# Patient Record
Sex: Male | Born: 1990 | Race: Black or African American | Hispanic: No | Marital: Single | Smoking: Current every day smoker
Health system: Southern US, Community
[De-identification: ages and names within clinical notes are randomized; demographics above are authoritative.]

## PROBLEM LIST (undated history)

## (undated) ENCOUNTER — Emergency Department (HOSPITAL_COMMUNITY): Admission: EM | Payer: Managed Care, Other (non HMO) | Source: Home / Self Care

## (undated) DIAGNOSIS — K219 Gastro-esophageal reflux disease without esophagitis: Secondary | ICD-10-CM

---

## 2009-05-16 ENCOUNTER — Emergency Department (HOSPITAL_COMMUNITY): Admission: EM | Admit: 2009-05-16 | Discharge: 2009-05-16 | Payer: Self-pay | Admitting: Emergency Medicine

## 2010-03-10 ENCOUNTER — Emergency Department (HOSPITAL_COMMUNITY): Admission: EM | Admit: 2010-03-10 | Discharge: 2010-03-10 | Payer: Self-pay | Admitting: Emergency Medicine

## 2010-06-02 ENCOUNTER — Emergency Department (HOSPITAL_COMMUNITY): Admission: EM | Admit: 2010-06-02 | Discharge: 2010-06-02 | Payer: Self-pay | Admitting: Family Medicine

## 2010-09-05 ENCOUNTER — Emergency Department (HOSPITAL_COMMUNITY): Admission: EM | Admit: 2010-09-05 | Discharge: 2010-09-05 | Payer: Self-pay | Admitting: Emergency Medicine

## 2010-09-07 ENCOUNTER — Emergency Department (HOSPITAL_COMMUNITY)
Admission: EM | Admit: 2010-09-07 | Discharge: 2010-09-07 | Payer: Self-pay | Source: Home / Self Care | Admitting: Family Medicine

## 2010-09-20 DEATH — deceased

## 2011-01-07 ENCOUNTER — Emergency Department (HOSPITAL_COMMUNITY): Payer: No Typology Code available for payment source

## 2011-01-07 ENCOUNTER — Emergency Department (HOSPITAL_COMMUNITY)
Admission: EM | Admit: 2011-01-07 | Discharge: 2011-01-07 | Disposition: A | Discharge status: Home / Self Care | Payer: No Typology Code available for payment source | Attending: Emergency Medicine | Admitting: Emergency Medicine

## 2011-01-07 DIAGNOSIS — M549 Dorsalgia, unspecified: Secondary | ICD-10-CM | POA: Insufficient documentation

## 2011-01-07 DIAGNOSIS — R079 Chest pain, unspecified: Secondary | ICD-10-CM | POA: Insufficient documentation

## 2011-12-30 ENCOUNTER — Encounter (HOSPITAL_COMMUNITY): Payer: Self-pay

## 2011-12-30 ENCOUNTER — Emergency Department (INDEPENDENT_AMBULATORY_CARE_PROVIDER_SITE_OTHER)
Admission: EM | Admit: 2011-12-30 | Discharge: 2011-12-30 | Disposition: A | Discharge status: Home / Self Care | Payer: BC Managed Care – PPO | Source: Home / Self Care | Attending: Family Medicine | Admitting: Family Medicine

## 2011-12-30 DIAGNOSIS — N341 Nonspecific urethritis: Secondary | ICD-10-CM

## 2011-12-30 MED ORDER — AZITHROMYCIN 250 MG PO TABS
1000.0000 mg | ORAL_TABLET | Freq: Once | ORAL | Status: AC
  Administered 2011-12-30: 1000 mg via ORAL

## 2011-12-30 MED ORDER — LIDOCAINE HCL (PF) 1 % IJ SOLN
INTRAMUSCULAR | Status: AC
  Filled 2011-12-30: qty 5

## 2011-12-30 MED ORDER — CEFTRIAXONE SODIUM 250 MG IJ SOLR
INTRAMUSCULAR | Status: AC
  Filled 2011-12-30: qty 250

## 2011-12-30 MED ORDER — CEFTRIAXONE SODIUM 1 G IJ SOLR
250.0000 mg | Freq: Once | INTRAMUSCULAR | Status: AC
  Administered 2011-12-30: 250 mg via INTRAMUSCULAR

## 2011-12-30 MED ORDER — AZITHROMYCIN 250 MG PO TABS
ORAL_TABLET | ORAL | Status: AC
  Filled 2011-12-30: qty 4

## 2011-12-30 NOTE — ED Notes (Signed)
Pt has had unprotected sex and states he has tingling of penis for two days, denies discharge.

## 2011-12-30 NOTE — ED Provider Notes (Signed)
History     CSN: [id number]  Arrival date & time 12/30/11  1017   First MD Initiated Contact with Patient 12/30/11 1113      Chief Complaint  Patient presents with  . Exposure to STD    (Consider location/radiation/quality/duration/timing/severity/associated sxs/prior treatment) Patient is a 21 y.o. male presenting with STD exposure. The history is provided by the patient.  Exposure to STD This is a new problem. The current episode started more than 2 days ago (condom broke during sex and since then has been having shooting pains at head of penis.). The problem has not changed since onset.   History reviewed. No pertinent past medical history.  History reviewed. No pertinent past surgical history.  History reviewed. No pertinent family history.  History  Substance Use Topics  . Smoking status: Current Everyday Smoker  . Smokeless tobacco: Not on file  . Alcohol Use: Yes      Review of Systems  Constitutional: Negative.   Gastrointestinal: Negative.   Genitourinary: Positive for penile pain. Negative for discharge, penile swelling and genital sores.    Allergies  Review of patient's allergies indicates no known allergies.  Home Medications  No current outpatient prescriptions on file.  BP 152/78  Pulse 88  Temp(Src) 97.5 F (36.4 C) (Oral)  Resp 16  SpO2 100%  Physical Exam  Nursing note and vitals reviewed. Constitutional: He appears well-developed and well-nourished.  Abdominal: Soft. Bowel sounds are normal. There is no tenderness.  Genitourinary: Penis normal. No penile tenderness.  Skin: Skin is warm and dry.  Psychiatric: He has a normal mood and affect.    ED Course  Procedures (including critical care time)   Labs Reviewed  GC/CHLAMYDIA PROBE AMP, GENITAL   No results found.   1. Urethritis, nonspecific       MDM         Barkley Bruns, MD 12/30/11 1150

## 2012-05-27 ENCOUNTER — Encounter: Payer: Managed Care, Other (non HMO) | Admitting: Medical

## 2013-10-15 ENCOUNTER — Other Ambulatory Visit (HOSPITAL_COMMUNITY)
Admission: RE | Admit: 2013-10-15 | Discharge: 2013-10-15 | Disposition: A | Discharge status: Home / Self Care | Payer: Managed Care, Other (non HMO) | Source: Ambulatory Visit | Attending: Emergency Medicine | Admitting: Emergency Medicine

## 2013-10-15 ENCOUNTER — Encounter (HOSPITAL_COMMUNITY): Payer: Self-pay | Admitting: Emergency Medicine

## 2013-10-15 ENCOUNTER — Emergency Department (INDEPENDENT_AMBULATORY_CARE_PROVIDER_SITE_OTHER)
Admission: EM | Admit: 2013-10-15 | Discharge: 2013-10-15 | Disposition: A | Discharge status: Home / Self Care | Payer: Managed Care, Other (non HMO) | Source: Home / Self Care | Attending: Emergency Medicine | Admitting: Emergency Medicine

## 2013-10-15 DIAGNOSIS — Z113 Encounter for screening for infections with a predominantly sexual mode of transmission: Secondary | ICD-10-CM | POA: Insufficient documentation

## 2013-10-15 DIAGNOSIS — N342 Other urethritis: Secondary | ICD-10-CM

## 2013-10-15 LAB — RPR: RPR Ser Ql: NONREACTIVE

## 2013-10-15 MED ORDER — LIDOCAINE HCL (PF) 1 % IJ SOLN
INTRAMUSCULAR | Status: AC
  Filled 2013-10-15: qty 5

## 2013-10-15 MED ORDER — AZITHROMYCIN 250 MG PO TABS
1000.0000 mg | ORAL_TABLET | Freq: Once | ORAL | Status: AC
  Administered 2013-10-15: 1000 mg via ORAL

## 2013-10-15 MED ORDER — AZITHROMYCIN 250 MG PO TABS
ORAL_TABLET | ORAL | Status: AC
  Filled 2013-10-15: qty 4

## 2013-10-15 MED ORDER — CEFTRIAXONE SODIUM 250 MG IJ SOLR
INTRAMUSCULAR | Status: AC
  Filled 2013-10-15: qty 250

## 2013-10-15 MED ORDER — CEFTRIAXONE SODIUM 250 MG IJ SOLR
250.0000 mg | Freq: Once | INTRAMUSCULAR | Status: AC
  Administered 2013-10-15: 250 mg via INTRAMUSCULAR

## 2013-10-15 NOTE — ED Provider Notes (Signed)
Chief Complaint:   Chief Complaint  Patient presents with  . SEXUALLY TRANSMITTED DISEASE    History of Present Illness:   Stephen Stanley is a 22 year old male who has had a two-day history of urethral discomfort, discharge, and slight burning. He denies any fever, chills, skin rash, or joint pain. He's had no blood in his urine. No testicular pain or swelling. No inguinal adenopathy. He is sexually active without consistent use of condoms. He tried to contact his girlfriend about this, but she would not return his calls.  Review of Systems:  Other than noted above, the patient denies any of the following symptoms: Systemic:  No fevers chills, aches, weight loss, arthralgias, myalgias, or adenopathy. GI:  No abdominal pain, nausea or vomiting. GU:  No dysuria, penile pain, discharge, itching, dysuria, genital lesions, testicular pain or swelling. Skin:  No rash or itching.  PMFSH:  Past medical history, family history, social history, meds, and allergies were reviewed.   Physical Exam:   Vital signs:  BP 125/78  Pulse 89  Temp(Src) 98.4 F (36.9 C) (Oral)  Resp 18  SpO2 98% Gen:  Alert, oriented, in no distress. Abdomen:  Soft and flat, non-distended, and non-tender.  No organomegaly or mass. Genital:  No urethral discharge or penile lesions. Testes were normal. Skin:  Warm and dry.  No rash.   Labs:  Urine for DNA probes for gonorrhea, Chlamydia, Trichomonas obtained as well as serologies for HIV and syphilis.  Course in Urgent Care Center:  Given Rocephin 250 mg IM and azithromycin 1000 mg by mouth.  Assessment:  The encounter diagnosis was Urethritis.  Plan:   1.  Meds:  The following meds were prescribed:  There are no discharge medications for this patient.   2.  Patient Education/Counseling:  The patient was given appropriate handouts, self care instructions, and instructed in symptomatic relief.The patient was instructed to inform all sexual contacts, avoid intercourse  completely for 2 weeks and then only with a condom.  The patient was told that we would call about all abnormal lab results, and that we would need to report certain kinds of infection to the health department.   3.  Follow up:  The patient was told to follow up if no better in 3 to 4 days, if becoming worse in any way, and given some red flag symptoms such as difficulty urinating which would prompt immediate return.  Follow up here as needed.     Reuben Likes, MD 10/15/13 2109

## 2013-10-15 NOTE — ED Notes (Signed)
C/o he has a "funny feeling " in penis, pain w urination; concerned about STD; partner will not return his calls

## 2013-10-20 NOTE — ED Notes (Signed)
Attempted to reach on cell number, number not valid; attempted to reach at home number, no answer; attempted to reach at work number, pt unknown

## 2013-10-21 ENCOUNTER — Telehealth (HOSPITAL_COMMUNITY): Payer: Self-pay | Admitting: *Deleted

## 2013-10-21 NOTE — ED Notes (Signed)
GC neg., Chlamydia pos., Trich neg., HIV/RPR non-reactive.  Pt. adequately treated with Zithromax and also got Rocephin.  I called and left a message to call. Vassie Moselle 10/21/2013

## 2013-10-21 NOTE — ED Notes (Signed)
Pt. called back.   Pt. verified x 2 and given results.  Pt. Told he was adequately treated.  Pt. instructed to notify his partner, no sex for 1 week and to practice safe sex. Pt. told he should get HIV rechecked in 6 mos at the [facility]. STD clinic, by appointment.  Pt. voiced understanding.  Michiel Cowboy RN faxed DHHS form to the [facility] on 12/1. Vassie Moselle 10/21/2013

## 2014-06-11 ENCOUNTER — Emergency Department (HOSPITAL_COMMUNITY): Payer: Managed Care, Other (non HMO)

## 2014-06-11 ENCOUNTER — Observation Stay (HOSPITAL_COMMUNITY)
Admission: EM | Admit: 2014-06-11 | Discharge: 2014-06-13 | Disposition: A | Discharge status: Home / Self Care | Payer: Managed Care, Other (non HMO) | Attending: General Surgery | Admitting: General Surgery

## 2014-06-11 ENCOUNTER — Encounter (HOSPITAL_COMMUNITY): Payer: Self-pay | Admitting: Emergency Medicine

## 2014-06-11 DIAGNOSIS — K3580 Unspecified acute appendicitis: Secondary | ICD-10-CM | POA: Diagnosis not present

## 2014-06-11 DIAGNOSIS — R110 Nausea: Secondary | ICD-10-CM

## 2014-06-11 DIAGNOSIS — F17200 Nicotine dependence, unspecified, uncomplicated: Secondary | ICD-10-CM | POA: Diagnosis not present

## 2014-06-11 DIAGNOSIS — R1031 Right lower quadrant pain: Secondary | ICD-10-CM

## 2014-06-11 DIAGNOSIS — K36 Other appendicitis: Secondary | ICD-10-CM

## 2014-06-11 LAB — COMPREHENSIVE METABOLIC PANEL
ALT: 16 U/L (ref 0–53)
ANION GAP: 13 (ref 5–15)
AST: 22 U/L (ref 0–37)
Albumin: 4.1 g/dL (ref 3.5–5.2)
Alkaline Phosphatase: 65 U/L (ref 39–117)
BUN: 9 mg/dL (ref 6–23)
CHLORIDE: 101 meq/L (ref 96–112)
CO2: 24 meq/L (ref 19–32)
CREATININE: 0.8 mg/dL (ref 0.50–1.35)
Calcium: 8.9 mg/dL (ref 8.4–10.5)
GFR calc Af Amer: >90 mL/min (ref >90)
GFR calc non Af Amer: >90 mL/min (ref >90)
GLUCOSE: 116 mg/dL — AB (ref 70–99)
POTASSIUM: 3.7 meq/L (ref 3.7–5.3)
Sodium: 138 mEq/L (ref 137–147)
TOTAL PROTEIN: 7.2 g/dL (ref 6.0–8.3)
Total Bilirubin: 1.2 mg/dL (ref 0.3–1.2)

## 2014-06-11 LAB — CBC WITH DIFFERENTIAL/PLATELET
BASOS ABS: 0 10*3/uL (ref 0.0–0.1)
Basophils Relative: 0 % (ref 0–1)
EOS ABS: 0 10*3/uL (ref 0.0–0.7)
Eosinophils Relative: 0 % (ref 0–5)
HCT: 39.9 % (ref 39.0–52.0)
HEMOGLOBIN: 13.7 g/dL (ref 13.0–17.0)
LYMPHS PCT: 9 % — AB (ref 12–46)
Lymphs Abs: 1.7 10*3/uL (ref 0.7–4.0)
MCH: 31.1 pg (ref 26.0–34.0)
MCHC: 34.3 g/dL (ref 30.0–36.0)
MCV: 90.7 fL (ref 78.0–100.0)
MONO ABS: 1.1 10*3/uL — AB (ref 0.1–1.0)
MONOS PCT: 6 % (ref 3–12)
NEUTROS ABS: 16.1 10*3/uL — AB (ref 1.7–7.7)
NEUTROS PCT: 85 % — AB (ref 43–77)
PLATELETS: 147 10*3/uL — AB (ref 150–400)
RBC: 4.4 MIL/uL (ref 4.22–5.81)
RDW: 12.4 % (ref 11.5–15.5)
WBC: 18.9 10*3/uL — AB (ref 4.0–10.5)

## 2014-06-11 LAB — URINALYSIS, ROUTINE W REFLEX MICROSCOPIC
BILIRUBIN URINE: NEGATIVE
GLUCOSE, UA: NEGATIVE mg/dL
HGB URINE DIPSTICK: NEGATIVE
KETONES UR: 15 mg/dL — AB
Leukocytes, UA: NEGATIVE
Nitrite: NEGATIVE
Protein, ur: NEGATIVE mg/dL
Specific Gravity, Urine: 1.011 (ref 1.005–1.030)
UROBILINOGEN UA: 0.2 mg/dL (ref 0.0–1.0)
pH: 6 (ref 5.0–8.0)

## 2014-06-11 LAB — LIPASE, BLOOD: Lipase: 15 U/L (ref 11–59)

## 2014-06-11 MED ORDER — SODIUM CHLORIDE 0.9 % IV SOLN
Freq: Once | INTRAVENOUS | Status: AC
  Administered 2014-06-11: 18:00:00 via INTRAVENOUS

## 2014-06-11 MED ORDER — PIPERACILLIN-TAZOBACTAM 3.375 G IVPB 30 MIN
3.3750 g | Freq: Once | INTRAVENOUS | Status: AC
  Administered 2014-06-11: 3.375 g via INTRAVENOUS
  Filled 2014-06-11: qty 50

## 2014-06-11 MED ORDER — ONDANSETRON HCL 4 MG/2ML IJ SOLN
4.0000 mg | Freq: Once | INTRAMUSCULAR | Status: AC
  Administered 2014-06-11: 4 mg via INTRAVENOUS
  Filled 2014-06-11: qty 2

## 2014-06-11 MED ORDER — MORPHINE SULFATE 4 MG/ML IJ SOLN
4.0000 mg | INTRAMUSCULAR | Status: AC | PRN
  Administered 2014-06-11 (×3): 4 mg via INTRAVENOUS
  Filled 2014-06-11 (×3): qty 1

## 2014-06-11 MED ORDER — PIPERACILLIN-TAZOBACTAM 3.375 G IVPB
3.3750 g | Freq: Three times a day (TID) | INTRAVENOUS | Status: DC
  Administered 2014-06-12 (×2): 3.375 g via INTRAVENOUS
  Filled 2014-06-11 (×4): qty 50

## 2014-06-11 MED ORDER — SODIUM CHLORIDE 0.9 % IV BOLUS (SEPSIS)
500.0000 mL | Freq: Once | INTRAVENOUS | Status: AC
  Administered 2014-06-11: 500 mL via INTRAVENOUS

## 2014-06-11 MED ORDER — ONDANSETRON HCL 4 MG/2ML IJ SOLN
4.0000 mg | Freq: Four times a day (QID) | INTRAMUSCULAR | Status: DC | PRN
Start: 2014-06-11 — End: 2014-06-13

## 2014-06-11 MED ORDER — SODIUM CHLORIDE 0.9 % IV SOLN
INTRAVENOUS | Status: DC
  Administered 2014-06-11 – 2014-06-12 (×2): via INTRAVENOUS

## 2014-06-11 MED ORDER — IOHEXOL 300 MG/ML  SOLN
100.0000 mL | Freq: Once | INTRAMUSCULAR | Status: AC | PRN
  Administered 2014-06-11: 100 mL via INTRAVENOUS

## 2014-06-11 MED ORDER — MORPHINE SULFATE 2 MG/ML IJ SOLN
2.0000 mg | INTRAMUSCULAR | Status: DC | PRN
  Administered 2014-06-11 – 2014-06-12 (×5): 2 mg via INTRAVENOUS
  Filled 2014-06-11 (×6): qty 1

## 2014-06-11 NOTE — ED Notes (Signed)
Patient pain level is 10. Severe pains in lower ab area

## 2014-06-11 NOTE — H&P (Signed)
Stephen Stanley is an 23 y.o. male.   Chief Complaint: rlq pain HPI: 37 yom who presents with acute onset rlq pain since yesterday. Nothing was making this better at home. Some nausea/episode of emesis. Having flatus, no bms.  Voiding ok.  No fever.  Came in because pain was not getting better.  History reviewed. No pertinent past medical history.  History reviewed. No pertinent past surgical history.  History reviewed. No pertinent family history. Social History:  reports that he has been smoking.  He does not have any smokeless tobacco history on file. He reports that he drinks alcohol. His drug history is not on file.  Allergies: No Known Allergies  Meds: mobic recently for tendonitis  Results for orders placed during the hospital encounter of 06/11/14 (from the past 48 hour(s))  COMPREHENSIVE METABOLIC PANEL     Status: Abnormal   Collection Time    06/11/14  4:51 PM      Result Value Ref Range   Sodium 138  137 - 147 mEq/L   Potassium 3.7  3.7 - 5.3 mEq/L   Chloride 101  96 - 112 mEq/L   CO2 24  19 - 32 mEq/L   Glucose, Bld 116 (*) 70 - 99 mg/dL   BUN 9  6 - 23 mg/dL   Creatinine, Ser 0.80  0.50 - 1.35 mg/dL   Calcium 8.9  8.4 - 10.5 mg/dL   Total Protein 7.2  6.0 - 8.3 g/dL   Albumin 4.1  3.5 - 5.2 g/dL   AST 22  0 - 37 U/L   ALT 16  0 - 53 U/L   Alkaline Phosphatase 65  39 - 117 U/L   Total Bilirubin 1.2  0.3 - 1.2 mg/dL   GFR calc non Af Amer >90  >90 mL/min   GFR calc Af Amer >90  >90 mL/min   Comment: (NOTE)     The eGFR has been calculated using the CKD EPI equation.     This calculation has not been validated in all clinical situations.     eGFR's persistently <90 mL/min signify possible Chronic Kidney     Disease.   Anion gap 13  5 - 15  CBC WITH DIFFERENTIAL     Status: Abnormal   Collection Time    06/11/14  4:51 PM      Result Value Ref Range   WBC 18.9 (*) 4.0 - 10.5 K/uL   RBC 4.40  4.22 - 5.81 MIL/uL   Hemoglobin 13.7  13.0 - 17.0 g/dL   HCT 39.9   39.0 - 52.0 %   MCV 90.7  78.0 - 100.0 fL   MCH 31.1  26.0 - 34.0 pg   MCHC 34.3  30.0 - 36.0 g/dL   RDW 12.4  11.5 - 15.5 %   Platelets 147 (*) 150 - 400 K/uL   Neutrophils Relative % 85 (*) 43 - 77 %   Neutro Abs 16.1 (*) 1.7 - 7.7 K/uL   Lymphocytes Relative 9 (*) 12 - 46 %   Lymphs Abs 1.7  0.7 - 4.0 K/uL   Monocytes Relative 6  3 - 12 %   Monocytes Absolute 1.1 (*) 0.1 - 1.0 K/uL   Eosinophils Relative 0  0 - 5 %   Eosinophils Absolute 0.0  0.0 - 0.7 K/uL   Basophils Relative 0  0 - 1 %   Basophils Absolute 0.0  0.0 - 0.1 K/uL  LIPASE, BLOOD     Status: None  Collection Time    06/11/14  4:51 PM      Result Value Ref Range   Lipase 15  11 - 59 U/L  URINALYSIS, ROUTINE W REFLEX MICROSCOPIC     Status: Abnormal   Collection Time    06/11/14  6:39 PM      Result Value Ref Range   Color, Urine YELLOW  YELLOW   APPearance CLEAR  CLEAR   Specific Gravity, Urine 1.011  1.005 - 1.030   pH 6.0  5.0 - 8.0   Glucose, UA NEGATIVE  NEGATIVE mg/dL   Hgb urine dipstick NEGATIVE  NEGATIVE   Bilirubin Urine NEGATIVE  NEGATIVE   Ketones, ur 15 (*) NEGATIVE mg/dL   Protein, ur NEGATIVE  NEGATIVE mg/dL   Urobilinogen, UA 0.2  0.0 - 1.0 mg/dL   Nitrite NEGATIVE  NEGATIVE   Leukocytes, UA NEGATIVE  NEGATIVE   Comment: MICROSCOPIC NOT DONE ON URINES WITH NEGATIVE PROTEIN, BLOOD, LEUKOCYTES, NITRITE, OR GLUCOSE <1000 mg/dL.   Ct Abdomen Pelvis W Contrast  06/11/2014   CLINICAL DATA:  Generalized and right lower quadrant discomfort since yesterday without fever or nausea or vomiting or diarrhea.  EXAM: CT ABDOMEN AND PELVIS WITH CONTRAST  TECHNIQUE: Multidetector CT imaging of the abdomen and pelvis was performed using the standard protocol following bolus administration of intravenous contrast.  CONTRAST:  186m OMNIPAQUE IOHEXOL 300 MG/ML SOLN intravenously ; the patient also received oral contrast material.  COMPARISON:  None.  FINDINGS: There is an inflamed edematous appendix in the right  lower quadrant. The appendiceal diameter is at least 15 mm there. There is an appendicolith. There is increased density in the periappendiceal fat. There is no discrete abscess. There is an large right-sided mesenteric lymph node on image 48 which measures 2.2 x 1.5 cm. There is a small amount of free fluid in the pelvis. The small and large bowel exhibit no acute abnormality.  The liver, pancreas, gallbladder, spleen, kidneys, urinary bladder, and abdominal aorta are normal.  The lung bases are clear. The lumbar spine and bony pelvis are unremarkable.  IMPRESSION: The findings are consistent with acute appendicitis. There is no discrete abscess but there is a small amount of fluid in the periappendiceal region and in the pelvis.  Critical Value/emergent results were called by telephone at the time of interpretation on 06/11/2014 at 8:06 pm to Dr. MTanna Furry, who verbally acknowledged these results.   Electronically Signed   By: David  JMartinique  On: 06/11/2014 20:09    Review of Systems  Constitutional: Negative for fever and chills.  Respiratory: Negative for shortness of breath.   Gastrointestinal: Positive for nausea, vomiting and abdominal pain.  Genitourinary: Negative for dysuria, urgency and frequency.    Blood pressure 134/76, pulse 92, temperature 99.3 F (37.4 C), temperature source Oral, resp. rate 18, height '6\' 1"'  (1.854 m), weight 145 lb (65.772 kg), SpO2 100.00%. Physical Exam  Constitutional: He appears well-developed and well-nourished.  Eyes: No scleral icterus.  Cardiovascular: Normal rate, regular rhythm and normal heart sounds.   Respiratory: Effort normal and breath sounds normal. He has no wheezes. He has no rales.  GI: Soft. Bowel sounds are normal. He exhibits no distension. There is tenderness in the right lower quadrant. There is tenderness at McBurney's point.     Assessment/Plan Acute appendicitis  We discussed pathophysiology of appendicitis.  Discussed laparoscopic  appendectomy and risks associated. I told them due to or time and emergencies this would likely be done  first thing tomorrow am.  I think that is reasonable also.  Will place on abx, give pain medication.  Toma Arts 06/11/2014, 8:35 PM

## 2014-06-11 NOTE — Progress Notes (Signed)
Called [phone] for report.  

## 2014-06-11 NOTE — Progress Notes (Signed)
ANTIBIOTIC CONSULT NOTE - INITIAL  Pharmacy Consult for zosyn Indication: intraabdominal infection  No Known Allergies  Patient Measurements: Height: 6\' 1"  (185.4 cm) Weight: 145 lb (65.772 kg) IBW/kg (Calculated) : 79.9   Vital Signs: Temp: 99.3 F (37.4 C) (07/23 1647) Temp src: Oral (07/23 1647) BP: 134/76 mmHg (07/23 1930) Pulse Rate: 92 (07/23 1930) Intake/Output from previous day:   Intake/Output from this shift:    Labs:  Recent Labs  06/11/14 1651  WBC 18.9*  HGB 13.7  PLT 147*  CREATININE 0.80   Estimated Creatinine Clearance: 132.5 ml/min (by C-G formula based on Cr of 0.8). No results found for this basename: VANCOTROUGH, VANCOPEAK, VANCORANDOM, GENTTROUGH, GENTPEAK, GENTRANDOM, TOBRATROUGH, TOBRAPEAK, TOBRARND, AMIKACINPEAK, AMIKACINTROU, AMIKACIN,  in the last 72 hours   Microbiology: No results found for this or any previous visit (from the past 720 hour(s)).  Medical History: History reviewed. No pertinent past medical history.  Medications:  See medication history Assessment: 23 yo man to start antibiotics for intraabdominal infection.  His baseline SrCr is 0.8 mg/dL and WBC 16.118.9.  Goal of Therapy:  Eradication of infection  Plan:  Zosyn 3.375 gm IV q8 hours. Will f/u clinical course.  Thanks for allowing pharmacy to be a part of this patient's care.  Talbert CageLora Irie Dowson, PharmD Clinical Pharmacist, [phone] 06/11/2014,9:14 PM

## 2014-06-11 NOTE — ED Notes (Signed)
Pt in c/o generalized abd pain, has started to increase to RLQ since yesterday, denies n/v/d, denies fever

## 2014-06-11 NOTE — ED Provider Notes (Signed)
CSN: [id number]     Arrival date & time 06/11/14  1633 History   First MD Initiated Contact with Patient 06/11/14 1712     Chief Complaint  Patient presents with  . Abdominal Pain      HPI  Patient presents with right mid abdominal pain. Symptoms started yesterday in a similar area. Worsening today. Continues to have an appetite. No nausea vomiting diarrhea. Pain with movement and cough. No previous abdominal surgeries. Otherwise healthy.  History reviewed. No pertinent past medical history. History reviewed. No pertinent past surgical history. History reviewed. No pertinent family history. History  Substance Use Topics  . Smoking status: Current Every Day Smoker  . Smokeless tobacco: Not on file  . Alcohol Use: Yes    Review of Systems  Constitutional: Negative for fever, chills, diaphoresis, appetite change and fatigue.  HENT: Negative for mouth sores, sore throat and trouble swallowing.   Eyes: Negative for visual disturbance.  Respiratory: Negative for cough, chest tightness, shortness of breath and wheezing.   Cardiovascular: Negative for chest pain.  Gastrointestinal: Positive for abdominal pain. Negative for nausea, vomiting, diarrhea and abdominal distention.  Endocrine: Negative for polydipsia, polyphagia and polyuria.  Genitourinary: Negative for dysuria, frequency and hematuria.  Musculoskeletal: Negative for gait problem.  Skin: Negative for color change, pallor and rash.  Neurological: Negative for dizziness, syncope, light-headedness and headaches.  Hematological: Does not bruise/bleed easily.  Psychiatric/Behavioral: Negative for behavioral problems and confusion.      Allergies  Review of patient's allergies indicates no known allergies.  Home Medications   Prior to Admission medications   Medication Sig Start Date End Date Taking? Authorizing Provider  ibuprofen (ADVIL,MOTRIN) 600 MG tablet Take 600 mg by mouth every 6 (six) hours as needed for mild  pain.   Yes Historical Provider, MD  meloxicam (MOBIC) 15 MG tablet Take 15 mg by mouth daily.  06/07/14  Yes Historical Provider, MD   BP 134/76  Pulse 92  Temp(Src) 99.3 F (37.4 C) (Oral)  Resp 18  Ht 6\' 1"  (1.854 m)  Wt 145 lb (65.772 kg)  BMI 19.13 kg/m2  SpO2 100% Physical Exam  Constitutional: He is oriented to person, place, and time. He appears well-developed and well-nourished. No distress.  HENT:  Head: Normocephalic.  Eyes: Conjunctivae are normal. Pupils are equal, round, and reactive to light. No scleral icterus.  Neck: Normal range of motion. Neck supple. No thyromegaly present.  Cardiovascular: Normal rate and regular rhythm.  Exam reveals no gallop and no friction rub.   No murmur heard. Pulmonary/Chest: Effort normal and breath sounds normal. No respiratory distress. He has no wheezes. He has no rales.  Abdominal: Soft. Bowel sounds are normal. He exhibits no distension. There is tenderness. There is no rebound.    Musculoskeletal: Normal range of motion.  Neurological: He is alert and oriented to person, place, and time.  Skin: Skin is warm and dry. No rash noted.  Psychiatric: He has a normal mood and affect. His behavior is normal.    ED Course  Procedures (including critical care time) Labs Review Labs Reviewed  COMPREHENSIVE METABOLIC PANEL - Abnormal; Notable for the following:    Glucose, Bld 116 (*)    All other components within normal limits  CBC WITH DIFFERENTIAL - Abnormal; Notable for the following:    WBC 18.9 (*)    Platelets 147 (*)    Neutrophils Relative % 85 (*)    Neutro Abs 16.1 (*)    Lymphocytes Relative  9 (*)    Monocytes Absolute 1.1 (*)    All other components within normal limits  URINALYSIS, ROUTINE W REFLEX MICROSCOPIC - Abnormal; Notable for the following:    Ketones, ur 15 (*)    All other components within normal limits  URINE CULTURE  LIPASE, BLOOD    Imaging Review Ct Abdomen Pelvis W Contrast  06/11/2014    CLINICAL DATA:  Generalized and right lower quadrant discomfort since yesterday without fever or nausea or vomiting or diarrhea.  EXAM: CT ABDOMEN AND PELVIS WITH CONTRAST  TECHNIQUE: Multidetector CT imaging of the abdomen and pelvis was performed using the standard protocol following bolus administration of intravenous contrast.  CONTRAST:  OMNIPAQUE IOHEXOL 300 MG/ML SOLN intravenously ; the patient also received oral contrast material.  COMPARISON:  None.  FINDINGS: There is an inflamed edematous appendix in the right lower quadrant. The appendiceal diameter is at least 15 mm there. There is an appendicolith. There is increased density in the periappendiceal fat. There is no discrete abscess. There is an large right-sided mesenteric lymph node on image 48 which measures 2.2 x 1.5 cm. There is a small amount of free fluid in the pelvis. The small and large bowel exhibit no acute abnormality.  The liver, pancreas, gallbladder, spleen, kidneys, urinary bladder, and abdominal aorta are normal.  The lung bases are clear. The lumbar spine and bony pelvis are unremarkable.  IMPRESSION: The findings are consistent with acute appendicitis. There is no discrete abscess but there is a small amount of fluid in the periappendiceal region and in the pelvis.  Critical Value/emergent results were called by telephone at the time of interpretation on 06/11/2014 at 8:06 pm to Dr. Rolland Porter , who verbally acknowledged these results.   Electronically Signed   By: David  [place]   On: 06/11/2014 20:09     EKG Interpretation None      MDM   Final diagnoses:  Other appendicitis    CT scan shows acute appendicitis. Small amount of fluid around the appendix but no frank abscess. No free air. I placed a call to general surgery. Informed the patient of the findings.    Rolland Porter, MD 06/11/14 2019

## 2014-06-12 ENCOUNTER — Encounter (HOSPITAL_COMMUNITY): Payer: Self-pay | Admitting: *Deleted

## 2014-06-12 ENCOUNTER — Encounter (HOSPITAL_COMMUNITY)
Admission: EM | Disposition: A | Discharge status: Home / Self Care | Payer: Self-pay | Source: Home / Self Care | Attending: Emergency Medicine

## 2014-06-12 ENCOUNTER — Observation Stay (HOSPITAL_COMMUNITY): Payer: Managed Care, Other (non HMO) | Admitting: Anesthesiology

## 2014-06-12 ENCOUNTER — Encounter (HOSPITAL_COMMUNITY): Payer: Managed Care, Other (non HMO) | Admitting: Anesthesiology

## 2014-06-12 DIAGNOSIS — K3580 Unspecified acute appendicitis: Secondary | ICD-10-CM

## 2014-06-12 HISTORY — PX: LAPAROSCOPIC APPENDECTOMY: SHX408

## 2014-06-12 LAB — SURGICAL PCR SCREEN
MRSA, PCR: NEGATIVE
Staphylococcus aureus: NEGATIVE

## 2014-06-12 LAB — URINE CULTURE
COLONY COUNT: NO GROWTH
Culture: NO GROWTH

## 2014-06-12 SURGERY — APPENDECTOMY, LAPAROSCOPIC
Anesthesia: General | Site: Abdomen

## 2014-06-12 MED ORDER — ROCURONIUM BROMIDE 100 MG/10ML IV SOLN
INTRAVENOUS | Status: DC | PRN
  Administered 2014-06-12: 40 mg via INTRAVENOUS

## 2014-06-12 MED ORDER — LIDOCAINE HCL (CARDIAC) 20 MG/ML IV SOLN
INTRAVENOUS | Status: DC | PRN
  Administered 2014-06-12: 50 mg via INTRAVENOUS

## 2014-06-12 MED ORDER — DEXTROSE-NACL 5-0.9 % IV SOLN
INTRAVENOUS | Status: DC
  Administered 2014-06-12 – 2014-06-13 (×3): via INTRAVENOUS

## 2014-06-12 MED ORDER — FENTANYL CITRATE 0.05 MG/ML IJ SOLN
INTRAMUSCULAR | Status: DC | PRN
  Administered 2014-06-12: 150 ug via INTRAVENOUS
  Administered 2014-06-12: 50 ug via INTRAVENOUS

## 2014-06-12 MED ORDER — ONDANSETRON HCL 4 MG/2ML IJ SOLN
INTRAMUSCULAR | Status: AC
  Filled 2014-06-12: qty 2

## 2014-06-12 MED ORDER — GLYCOPYRROLATE 0.2 MG/ML IJ SOLN
INTRAMUSCULAR | Status: DC | PRN
  Administered 2014-06-12: .8 mg via INTRAVENOUS

## 2014-06-12 MED ORDER — SODIUM CHLORIDE 0.9 % IR SOLN
Status: DC | PRN
  Administered 2014-06-12: 1000 mL

## 2014-06-12 MED ORDER — ONDANSETRON HCL 4 MG/2ML IJ SOLN
INTRAMUSCULAR | Status: DC | PRN
  Administered 2014-06-12: 4 mg via INTRAVENOUS

## 2014-06-12 MED ORDER — HYDROMORPHONE HCL PF 1 MG/ML IJ SOLN
1.0000 mg | INTRAMUSCULAR | Status: DC | PRN
  Administered 2014-06-12 – 2014-06-13 (×3): 1 mg via INTRAVENOUS
  Filled 2014-06-12 (×3): qty 1

## 2014-06-12 MED ORDER — SUCCINYLCHOLINE CHLORIDE 20 MG/ML IJ SOLN
INTRAMUSCULAR | Status: DC | PRN
  Administered 2014-06-12: 100 mg via INTRAVENOUS

## 2014-06-12 MED ORDER — SUCCINYLCHOLINE CHLORIDE 20 MG/ML IJ SOLN
INTRAMUSCULAR | Status: AC
  Filled 2014-06-12: qty 1

## 2014-06-12 MED ORDER — BUPIVACAINE HCL 0.25 % IJ SOLN
INTRAMUSCULAR | Status: DC | PRN
Start: 2014-06-12 — End: 2014-06-12
  Administered 2014-06-12: 30 mL

## 2014-06-12 MED ORDER — DEXAMETHASONE SODIUM PHOSPHATE 10 MG/ML IJ SOLN
INTRAMUSCULAR | Status: DC | PRN
  Administered 2014-06-12: 8 mg via INTRAVENOUS

## 2014-06-12 MED ORDER — MIDAZOLAM HCL 5 MG/5ML IJ SOLN
INTRAMUSCULAR | Status: DC | PRN
  Administered 2014-06-12: 2 mg via INTRAVENOUS

## 2014-06-12 MED ORDER — GLYCOPYRROLATE 0.2 MG/ML IJ SOLN
INTRAMUSCULAR | Status: AC
  Filled 2014-06-12: qty 4

## 2014-06-12 MED ORDER — OXYCODONE-ACETAMINOPHEN 5-325 MG PO TABS
1.0000 | ORAL_TABLET | ORAL | Status: DC | PRN
  Administered 2014-06-12 – 2014-06-13 (×4): 2 via ORAL
  Filled 2014-06-12 (×4): qty 2

## 2014-06-12 MED ORDER — PROPOFOL 10 MG/ML IV BOLUS
INTRAVENOUS | Status: DC | PRN
  Administered 2014-06-12: 200 mg via INTRAVENOUS

## 2014-06-12 MED ORDER — HYDROMORPHONE HCL PF 1 MG/ML IJ SOLN
INTRAMUSCULAR | Status: AC
  Filled 2014-06-12: qty 1

## 2014-06-12 MED ORDER — LACTATED RINGERS IV SOLN
INTRAVENOUS | Status: DC
  Administered 2014-06-12: 50 mL/h via INTRAVENOUS

## 2014-06-12 MED ORDER — HYDROMORPHONE HCL PF 1 MG/ML IJ SOLN
0.2500 mg | INTRAMUSCULAR | Status: DC | PRN
  Administered 2014-06-12 (×4): 0.5 mg via INTRAVENOUS
  Filled 2014-06-12: qty 1

## 2014-06-12 MED ORDER — ONDANSETRON HCL 4 MG PO TABS: 4.0000 mg | ORAL_TABLET | Freq: Four times a day (QID) | ORAL | Status: DC | PRN

## 2014-06-12 MED ORDER — DEXAMETHASONE SODIUM PHOSPHATE 4 MG/ML IJ SOLN
INTRAMUSCULAR | Status: AC
  Filled 2014-06-12: qty 2

## 2014-06-12 MED ORDER — ROCURONIUM BROMIDE 50 MG/5ML IV SOLN
INTRAVENOUS | Status: AC
  Filled 2014-06-12: qty 1

## 2014-06-12 MED ORDER — ONDANSETRON HCL 4 MG/2ML IJ SOLN: 4.0000 mg | Freq: Four times a day (QID) | INTRAMUSCULAR | Status: DC | PRN

## 2014-06-12 MED ORDER — 0.9 % SODIUM CHLORIDE (POUR BTL) OPTIME
TOPICAL | Status: DC | PRN
  Administered 2014-06-12: 1000 mL

## 2014-06-12 MED ORDER — EPHEDRINE SULFATE 50 MG/ML IJ SOLN
INTRAMUSCULAR | Status: AC
  Filled 2014-06-12: qty 1

## 2014-06-12 MED ORDER — FENTANYL CITRATE 0.05 MG/ML IJ SOLN
INTRAMUSCULAR | Status: AC
  Filled 2014-06-12: qty 5

## 2014-06-12 MED ORDER — NEOSTIGMINE METHYLSULFATE 10 MG/10ML IV SOLN
INTRAVENOUS | Status: DC | PRN
Start: 2014-06-12 — End: 2014-06-12
  Administered 2014-06-12: 4 mg via INTRAVENOUS

## 2014-06-12 MED ORDER — MIDAZOLAM HCL 2 MG/2ML IJ SOLN
INTRAMUSCULAR | Status: AC
  Filled 2014-06-12: qty 2

## 2014-06-12 MED ORDER — NEOSTIGMINE METHYLSULFATE 10 MG/10ML IV SOLN
INTRAVENOUS | Status: AC
  Filled 2014-06-12: qty 1

## 2014-06-12 MED ORDER — BUPIVACAINE HCL (PF) 0.25 % IJ SOLN
INTRAMUSCULAR | Status: AC
  Filled 2014-06-12: qty 30

## 2014-06-12 MED ORDER — LACTATED RINGERS IV SOLN
INTRAVENOUS | Status: DC | PRN
  Administered 2014-06-12 (×2): via INTRAVENOUS

## 2014-06-12 MED ORDER — PROPOFOL 10 MG/ML IV BOLUS
INTRAVENOUS | Status: AC
  Filled 2014-06-12: qty 20

## 2014-06-12 SURGICAL SUPPLY — 50 items
APL SKNCLS STERI-STRIP NONHPOA (GAUZE/BANDAGES/DRESSINGS) ×1
APPLIER CLIP 5 13 M/L LIGAMAX5 (MISCELLANEOUS)
APR CLP MED LRG 5 ANG JAW (MISCELLANEOUS)
BENZOIN TINCTURE PRP APPL 2/3 (GAUZE/BANDAGES/DRESSINGS) ×3 IMPLANT
BLADE SURG ROTATE 9660 (MISCELLANEOUS) ×2 IMPLANT
CANISTER SUCTION 2500CC (MISCELLANEOUS) ×3 IMPLANT
CHLORAPREP W/TINT 26ML (MISCELLANEOUS) ×3 IMPLANT
CLIP APPLIE 5 13 M/L LIGAMAX5 (MISCELLANEOUS) IMPLANT
CLOSURE WOUND 1/4 X3 (GAUZE/BANDAGES/DRESSINGS) ×1
COVER SURGICAL LIGHT HANDLE (MISCELLANEOUS) ×3 IMPLANT
COVER TRANSDUCER ULTRASND (DRAPES) ×3 IMPLANT
DEVICE TROCAR PUNCTURE CLOSURE (ENDOMECHANICALS) ×3 IMPLANT
DRAPE UTILITY 15X26 W/TAPE STR (DRAPE) ×6 IMPLANT
ELECT REM PT RETURN 9FT ADLT (ELECTROSURGICAL) ×3
ELECTRODE REM PT RTRN 9FT ADLT (ELECTROSURGICAL) ×1 IMPLANT
ENDOLOOP SUT PDS II  0 18 (SUTURE) ×6
ENDOLOOP SUT PDS II 0 18 (SUTURE) ×3 IMPLANT
GAUZE SPONGE 2X2 8PLY STRL LF (GAUZE/BANDAGES/DRESSINGS) IMPLANT
GLOVE BIO SURGEON STRL SZ 6.5 (GLOVE) ×1 IMPLANT
GLOVE BIO SURGEON STRL SZ7 (GLOVE) ×2 IMPLANT
GLOVE BIO SURGEON STRL SZ7.5 (GLOVE) ×3 IMPLANT
GLOVE BIO SURGEONS STRL SZ 6.5 (GLOVE) ×1
GLOVE BIOGEL PI IND STRL 7.0 (GLOVE) IMPLANT
GLOVE BIOGEL PI INDICATOR 7.0 (GLOVE) ×2
GLOVE SURG SS PI 7.0 STRL IVOR (GLOVE) ×2 IMPLANT
GOWN STRL REUS W/ TWL LRG LVL3 (GOWN DISPOSABLE) ×2 IMPLANT
GOWN STRL REUS W/ TWL XL LVL3 (GOWN DISPOSABLE) ×1 IMPLANT
GOWN STRL REUS W/TWL LRG LVL3 (GOWN DISPOSABLE) ×6
GOWN STRL REUS W/TWL XL LVL3 (GOWN DISPOSABLE) ×3
KIT BASIN OR (CUSTOM PROCEDURE TRAY) ×3 IMPLANT
KIT ROOM TURNOVER OR (KITS) ×3 IMPLANT
NDL INSUFFLATION 14GA 120MM (NEEDLE) ×1 IMPLANT
NEEDLE INSUFFLATION 14GA 120MM (NEEDLE) ×3 IMPLANT
NS IRRIG 1000ML POUR BTL (IV SOLUTION) ×3 IMPLANT
PAD ARMBOARD 7.5X6 YLW CONV (MISCELLANEOUS) ×6 IMPLANT
SCISSORS LAP 5X35 DISP (ENDOMECHANICALS) ×3 IMPLANT
SET IRRIG TUBING LAPAROSCOPIC (IRRIGATION / IRRIGATOR) ×3 IMPLANT
SLEEVE ENDOPATH XCEL 5M (ENDOMECHANICALS) ×3 IMPLANT
SPECIMEN JAR SMALL (MISCELLANEOUS) ×1 IMPLANT
SPONGE GAUZE 2X2 STER 10/PKG (GAUZE/BANDAGES/DRESSINGS) ×2
STRIP CLOSURE SKIN 1/4X3 (GAUZE/BANDAGES/DRESSINGS) ×1 IMPLANT
SUT MNCRL AB 3-0 PS2 18 (SUTURE) ×5 IMPLANT
SUT VIC AB 1 BRD 54 (SUTURE) IMPLANT
TAPE PAPER 2X10 WHT MICROPORE (GAUZE/BANDAGES/DRESSINGS) ×2 IMPLANT
TOWEL OR 17X24 6PK STRL BLUE (TOWEL DISPOSABLE) ×3 IMPLANT
TOWEL OR 17X26 10 PK STRL BLUE (TOWEL DISPOSABLE) ×3 IMPLANT
TRAY FOLEY CATH 16FR SILVER (SET/KITS/TRAYS/PACK) ×3 IMPLANT
TRAY LAPAROSCOPIC (CUSTOM PROCEDURE TRAY) ×3 IMPLANT
TROCAR XCEL NON-BLD 11X100MML (ENDOMECHANICALS) ×3 IMPLANT
TROCAR XCEL NON-BLD 5MMX100MML (ENDOMECHANICALS) ×3 IMPLANT

## 2014-06-12 NOTE — Anesthesia Preprocedure Evaluation (Addendum)
Anesthesia Evaluation  Patient identified by MRN, date of birth, ID band Patient awake    Reviewed: Allergy & Precautions, H&P , NPO status , Patient's Chart, lab work & pertinent test results  Airway Mallampati: I TM Distance: >3 FB Neck ROM: full    Dental  (+) Dental Advidsory Given, Teeth Intact   Pulmonary Current Smoker,          Cardiovascular     Neuro/Psych    GI/Hepatic   Endo/Other    Renal/GU      Musculoskeletal   Abdominal   Peds  Hematology   Anesthesia Other Findings   Reproductive/Obstetrics                          Anesthesia Physical Anesthesia Plan  ASA: I and emergent  Anesthesia Plan:    Post-op Pain Management:    Induction:   Airway Management Planned:   Additional Equipment:   Intra-op Plan:   Post-operative Plan:   Informed Consent:   Dental Advisory Given  Plan Discussed with: Anesthesiologist, CRNA and Surgeon  Anesthesia Plan Comments:        Anesthesia Quick Evaluation

## 2014-06-12 NOTE — Transfer of Care (Signed)
Immediate Anesthesia Transfer of Care Note  Patient: Stephen Stanley  Procedure(s) Performed: Procedure(s): APPENDECTOMY LAPAROSCOPIC (N/A)  Patient Location: PACU  Anesthesia Type:General  Level of Consciousness: awake, alert  and oriented  Airway & Oxygen Therapy: Patient Spontanous Breathing and Patient connected to nasal cannula oxygen  Post-op Assessment: Report given to PACU RN, Post -op Vital signs reviewed and stable and Patient moving all extremities X 4  Post vital signs: Reviewed and stable  Complications: No apparent anesthesia complications

## 2014-06-12 NOTE — Discharge Instructions (Signed)
[facility] ______[facility] °LAPAROSCOPIC SURGERY: POST OP INSTRUCTIONS °Always review your discharge instruction sheet given to you by the facility where your surgery was performed. °IF YOU HAVE DISABILITY OR FAMILY LEAVE FORMS, YOU MUST BRING THEM TO THE OFFICE FOR PROCESSING.   °DO NOT GIVE THEM TO YOUR DOCTOR. ° °1. A prescription for pain medication may be given to you upon discharge.  Take your pain medication as prescribed, if needed.  If narcotic pain medicine is not needed, then you may take acetaminophen (Tylenol) or ibuprofen (Advil) as needed. °2. Take your usually prescribed medications unless otherwise directed. °3. If you need a refill on your pain medication, please contact your pharmacy.  They will contact our office to request authorization. Prescriptions will not be filled after 5pm or on week-ends. °4. You should follow a light diet the first few days after arrival home, such as soup and crackers, etc.  Be sure to include lots of fluids daily. °5. Most patients will experience some swelling and bruising in the area of the incisions.  Ice packs will help.  Swelling and bruising can take several days to resolve.  °6. It is common to experience some constipation if taking pain medication after surgery.  Increasing fluid intake and taking a stool softener (such as Colace) will usually help or prevent this problem from occurring.  A mild laxative (Milk of Magnesia or Miralax) should be taken according to package instructions if there are no bowel movements after 48 hours. °7. Unless discharge instructions indicate otherwise, you may remove your bandages 24-48 hours after surgery, and you may shower at that time.  You may have steri-strips (small skin tapes) in place directly over the incision.  These strips should be left on the skin for 7-10 days.  If your surgeon used skin glue on the incision, you may shower in 24 hours.  The glue will flake off over the next 2-3 weeks.  Any sutures or  staples will be removed at the office during your follow-up visit. °8. ACTIVITIES:  You may resume regular (light) daily activities beginning the next day--such as daily self-care, walking, climbing stairs--gradually increasing activities as tolerated.  You may have sexual intercourse when it is comfortable.  Refrain from any heavy lifting or straining until approved by your doctor. °a. You may drive when you are no longer taking prescription pain medication, you can comfortably wear a seatbelt, and you can safely maneuver your car and apply brakes. °b. RETURN TO WORK:  __________________________________________________________ °9. You should see your doctor in the office for a follow-up appointment approximately 2-3 weeks after your surgery.  Make sure that you call for this appointment within a day or two after you arrive home to insure a convenient appointment time. °10. OTHER INSTRUCTIONS: __________________________________________________________________________________________________________________________ __________________________________________________________________________________________________________________________ °WHEN TO CALL YOUR DOCTOR: °1. Fever over 101.0 °2. Inability to urinate °3. Continued bleeding from incision. °4. Increased pain, redness, or drainage from the incision. °5. Increasing abdominal pain ° °The clinic staff is available to answer your questions during regular business hours.  Please don’t hesitate to call and ask to speak to one of the nurses for clinical concerns.  If you have a medical emergency, go to the nearest emergency room or call 911.  A surgeon from [facility] is always on call at the hospital. °[address], [address], [place], [place]  [address] ? [address] [address], [place], [place]   [address] °[phone] ? [phone] ? FAX [phone] °Web site:   [email or web address] °

## 2014-06-12 NOTE — Progress Notes (Signed)
Patient back from surgery. Offered ice chips. Incentive spirometry begun. SCDs on. Fluids up. VSS. Pain medication administered. Will monitor activity and comfort. Sherlyn Leeseleha, Takya Vandivier S, RN

## 2014-06-12 NOTE — Anesthesia Procedure Notes (Signed)
Procedure Name: Intubation Date/Time: 06/12/2014 8:47 AM Performed by: Carmela RimaMARTINELLI, Hersh Minney F Pre-anesthesia Checklist: Patient being monitored, Suction available, Emergency Drugs available, Patient identified and Timeout performed Patient Re-evaluated:Patient Re-evaluated prior to inductionOxygen Delivery Method: Circle system utilized Preoxygenation: Pre-oxygenation with 100% oxygen Intubation Type: IV induction, Rapid sequence and Cricoid Pressure applied Laryngoscope Size: Mac and 4 Grade View: Grade II Tube type: Oral Tube size: 7.5 mm Number of attempts: 1 Placement Confirmation: positive ETCO2,  ETT inserted through vocal cords under direct vision and breath sounds checked- equal and bilateral Secured at: 23 cm Tube secured with: Tape Dental Injury: Teeth and Oropharynx as per pre-operative assessment

## 2014-06-12 NOTE — Op Note (Signed)
06/11/2014 - 06/12/2014  9:45 AM  PATIENT:  Stephen Stanley  23 y.o. male  PRE-OPERATIVE DIAGNOSIS:  Acute appendicitis  POST-OPERATIVE DIAGNOSIS:  Acute appendicitis  PROCEDURE:  Procedure(s): APPENDECTOMY LAPAROSCOPIC (N/A)  SURGEON:  Surgeon(s) and Role:    * Axel FillerArmando Daris Aristizabal, MD - Primary  PHYSICIAN ASSISTANT:   ASSISTANTS: Norm SaltMary Stuckey, PA-Student   ANESTHESIA:   local and general  EBL:  Total I/O In: 1000 [I.V.:1000] Out: -   BLOOD ADMINISTERED:none  DRAINS: none   LOCAL MEDICATIONS USED:  BUPIVICAINE   SPECIMEN:  Source of Specimen:  appendix  DISPOSITION OF SPECIMEN:  PATHOLOGY  COUNTS:  YES  TOURNIQUET:  * No tourniquets in log *  DICTATION: .Dragon Dictation   Details of the procedure:The patient was taken back to the operating room. The patient was placed in supine position with bilateral SCDs in place. After appropriate anitbiotics were confirmed, a time-out was confirmed and all facts were verified.  A pneumoperitoneum of 14 mmHg was obtained via a Veress needle technique in the left lower quadrant quadrant.  A 5 mm trocar and 5 mm camera then placed intra-abdominally there is no injury to any intra-abdominal organs a 10 mm infraumbilical port was placed and direct visualization as was a 5 mm port in the suprapubic area. The appendix was identified  The appendix was dissected away from the retroperitoneal attachements. The appendix identified and cleaned down to the appendiceal base. The appendiceal artery was taken with electrocautery maintaining hemostasis, the mesoappendix was then incised.  The the appendiceal base was clean.  At this time an Endoloop was placed proximallyx2 and one distally and the appendix was transected between these 2. A latex retrieval bag was then placed into the abdomen and the specimen placed into the bag. The appendiceal stump was cauterized. We evacuate the fluid from the pelvis until the effluent was clear. The omentum was brought  over the appendiceal stump. The appendix and latex retrieval  bag was then retrieved via the supraumbilical port. #1 Vicryl was used to reapproximate the fascia at the umbilical port site x1. The skin was reapproximated all port sites 3-0 Monocryl subcuticular fashion. The skin was dressed with Steri-Strips gauze and tape. The patient was awakened from general anesthesia was taken to recovery room in stable condition.       PLAN OF CARE: Admit to inpatient   PATIENT DISPOSITION:  PACU - hemodynamically stable.   Delay start of Pharmacological VTE agent (>24hrs) due to surgical blood loss or risk of bleeding: not applicable

## 2014-06-12 NOTE — Progress Notes (Signed)
Pt complained of tightness in his chest, vitals within normal limits, EKG done and noraml. MD encouraging walking. Pt most likely experiencing gas pain.

## 2014-06-12 NOTE — Anesthesia Postprocedure Evaluation (Signed)
  Anesthesia Post-op Note  Patient: Stephen EdelsonSteven Buell  Procedure(s) Performed: Procedure(s): APPENDECTOMY LAPAROSCOPIC (N/A)  Patient Location: PACU  Anesthesia Type:General  Level of Consciousness: awake  Airway and Oxygen Therapy: Patient Spontanous Breathing  Post-op Pain: mild  Post-op Assessment: Post-op Vital signs reviewed  Post-op Vital Signs: Reviewed  Last Vitals:  Filed Vitals:   06/12/14 1037  BP:   Pulse: 74  Temp: 36.3 C  Resp: 19    Complications: No apparent anesthesia complications

## 2014-06-12 NOTE — Progress Notes (Signed)
Report called to OR. Checklist, consents, PCR and HCG bath done by night nurse and tech. Patient stable. Sherlyn Leeseleha, Rayner Erman S, RN

## 2014-06-13 MED ORDER — HYDROCODONE-ACETAMINOPHEN 5-325 MG PO TABS: 1.0000 | ORAL_TABLET | Freq: Four times a day (QID) | ORAL | Status: DC | PRN

## 2014-06-13 NOTE — Discharge Summary (Signed)
  Patient ID: Stephen Stanley [id number] 22 y.o. 1991/03/25  Admit date: 06/11/2014  Discharge date and time: 06/13/2014  Admitting Physician: Axel FillerArmando Ramirez, MD  Discharge Physician: Ernestene MentionINGRAM,Avenir Lozinski M  Admission Diagnoses: Other appendicitis [542]  Discharge Diagnoses: Acute appendicitis  Operations: Procedure(s): APPENDECTOMY LAPAROSCOPIC  Admission Condition: fair  Discharged Condition: good  Indication for Admission: 23 year old [place] gentleman who presented with acute onset right lower quadrant pain for 24 hours. Progressive. Some nausea one episode of emesis. No diarrhea. No voiding symptoms. No fever or chills.Initial exam revealed right lower quadrant tenderness with guarding, localized.WBC 18,900.Urinalysis unremarkable.CT showed findings consistent with acute appendicitis but no evidence of abscess or perforation.  Hospital Course: The patient was admitted by Dr. Dwain SarnaWakefield and start him down IV fluids, IV antibiotics, and pain medication. The patient was taken to the operating room by Dr. Axel FillerArmando Ramirez and underwent a laparoscopic appendectomy. Clinical findings were consistent with acute appendicitis. Final pathology is pending. The patient progressed in his diet and activities uneventfully. On the day of discharge he was alert area tolerating regular diet. Voiding uneventfully. Abdomen was soft, nondistended. Wounds were clean. He wanted to go home. He was given instructions in diet and activities. He was given followup in 2 weeks in our office. He was given a prescription for Norco for pain.  Consults: None  Significant Diagnostic Studies: Lab work and CT scan. Surgical pathology pending.  Treatments: surgery: Laparoscopic appendectomy  Disposition: Home  Patient Instructions:    Medication List         HYDROcodone-acetaminophen 5-325 MG per tablet  Commonly known as:  NORCO  Take 1-2 tablets by mouth every 6 (six) hours as needed.     ibuprofen 600  MG tablet  Commonly known as:  ADVIL,MOTRIN  Take 600 mg by mouth every 6 (six) hours as needed for mild pain.     meloxicam 15 MG tablet  Commonly known as:  MOBIC  Take 15 mg by mouth daily.        Activity: activity as tolerated Diet: regular diet Wound Care: none needed  Follow-up:  With ccs,md in 2 weeks.  Signed: Angelia MouldHaywood M. Derrell Stanley, M.D., FACS General and minimally invasive surgery Breast and Colorectal Surgery  06/13/2014, 11:02 AM

## 2014-06-13 NOTE — Progress Notes (Signed)
Stephen EdelsonSteven Stanley to be D/C'd Home per MD order.  Discussed with the patient and all questions fully answered.    Medication List         HYDROcodone-acetaminophen 5-325 MG per tablet  Commonly known as:  NORCO  Take 1-2 tablets by mouth every 6 (six) hours as needed.     ibuprofen 600 MG tablet  Commonly known as:  ADVIL,MOTRIN  Take 600 mg by mouth every 6 (six) hours as needed for mild pain.     meloxicam 15 MG tablet  Commonly known as:  MOBIC  Take 15 mg by mouth daily.        VSS, Skin clean, dry and intact without evidence of skin break down, no evidence of skin tears noted.  Surgical site dressing intact and dry.    IV catheter discontinued intact. Site without signs and symptoms of complications. Dressing and pressure applied.  An After Visit Summary was printed and given to the patient.  D/c education completed with patient/family including follow up instructions, medication list, d/c activities limitations if indicated, with other d/c instructions as indicated by MD - patient able to verbalize understanding, all questions fully answered. [facility]  Patient instructed to return to ED, call 911, or call MD for any changes in condition.   Patient escorted via WC, and D/C home via private auto.  Burt EkCook, Dimitrius Steedman D 06/13/2014 11:20 AM

## 2014-06-16 ENCOUNTER — Encounter (HOSPITAL_COMMUNITY): Payer: Self-pay | Admitting: General Surgery

## 2014-07-07 ENCOUNTER — Encounter (INDEPENDENT_AMBULATORY_CARE_PROVIDER_SITE_OTHER): Payer: Managed Care, Other (non HMO)

## 2014-07-15 ENCOUNTER — Ambulatory Visit (INDEPENDENT_AMBULATORY_CARE_PROVIDER_SITE_OTHER): Payer: Managed Care, Other (non HMO) | Admitting: General Surgery

## 2014-07-15 ENCOUNTER — Encounter (INDEPENDENT_AMBULATORY_CARE_PROVIDER_SITE_OTHER): Payer: Self-pay | Admitting: General Surgery

## 2014-07-15 VITALS — BP 134/80 | HR 81 | Temp 97.2°F | Ht 72.0 in | Wt 149.0 lb

## 2014-07-15 DIAGNOSIS — Z9889 Other specified postprocedural states: Secondary | ICD-10-CM

## 2014-07-15 NOTE — Progress Notes (Signed)
Patient ID: Stephen Stanley, male   DOB: 02/02/1991, 23 y.o.   MRN: [id number] Post op course The patient is a 23 year old male status post lap appendectomy. Patient did well postoperatively.  On Exam: Wounds are clean dry and intact.  Pathology:   Acute suppurative appendicitis.  This was discussed with the patient.  Assessment and Plan 23 year old male status post lap appendectomy 1. Patient to follow up as needed   Axel Filler, MD [facility] General & Minimally Invasive Surgery Trauma & Emergency Surgery

## 2016-06-03 ENCOUNTER — Emergency Department (HOSPITAL_COMMUNITY)
Admission: EM | Admit: 2016-06-03 | Discharge: 2016-06-03 | Disposition: A | Discharge status: Home / Self Care | Payer: Managed Care, Other (non HMO) | Attending: Emergency Medicine | Admitting: Emergency Medicine

## 2016-06-03 ENCOUNTER — Encounter (HOSPITAL_COMMUNITY): Payer: Self-pay

## 2016-06-03 DIAGNOSIS — Z79899 Other long term (current) drug therapy: Secondary | ICD-10-CM | POA: Insufficient documentation

## 2016-06-03 DIAGNOSIS — L02415 Cutaneous abscess of right lower limb: Secondary | ICD-10-CM | POA: Insufficient documentation

## 2016-06-03 DIAGNOSIS — L0291 Cutaneous abscess, unspecified: Secondary | ICD-10-CM

## 2016-06-03 DIAGNOSIS — F17200 Nicotine dependence, unspecified, uncomplicated: Secondary | ICD-10-CM | POA: Insufficient documentation

## 2016-06-03 MED ORDER — LIDOCAINE-EPINEPHRINE (PF) 2 %-1:200000 IJ SOLN
20.0000 mL | Freq: Once | INTRAMUSCULAR | Status: AC
  Administered 2016-06-03: 20 mL
  Filled 2016-06-03: qty 20

## 2016-06-03 MED ORDER — TETANUS-DIPHTH-ACELL PERTUSSIS 5-2.5-18.5 LF-MCG/0.5 IM SUSP: 0.5000 mL | Freq: Once | INTRAMUSCULAR | Status: DC

## 2016-06-03 MED ORDER — DOXYCYCLINE HYCLATE 100 MG PO CAPS: 100.0000 mg | ORAL_CAPSULE | Freq: Two times a day (BID) | ORAL | Status: AC

## 2016-06-03 NOTE — Discharge Instructions (Signed)
Keep your wounds clean and dry and do not submerge them in water such as bathtub or swimming pool. Follow-up at urgent care or here at the emergency department for wound recheck in 2 days.  Return to the emergency department if you experience worsening pain, increased swelling, increased redness, fevers or chills.  Abscess An abscess is an infected area that contains a collection of pus and debris.It can occur in almost any part of the body. An abscess is also known as a furuncle or boil. CAUSES  An abscess occurs when tissue gets infected. This can occur from blockage of oil or sweat glands, infection of hair follicles, or a minor injury to the skin. As the body tries to fight the infection, pus collects in the area and creates pressure under the skin. This pressure causes pain. People with weakened immune systems have difficulty fighting infections and get certain abscesses more often.  SYMPTOMS Usually an abscess develops on the skin and becomes a painful mass that is red, warm, and tender. If the abscess forms under the skin, you may feel a moveable soft area under the skin. Some abscesses break open (rupture) on their own, but most will continue to get worse without care. The infection can spread deeper into the body and eventually into the bloodstream, causing you to feel ill.  DIAGNOSIS  Your caregiver will take your medical history and perform a physical exam. A sample of fluid may also be taken from the abscess to determine what is causing your infection. TREATMENT  Your caregiver may prescribe antibiotic medicines to fight the infection. However, taking antibiotics alone usually does not cure an abscess. Your caregiver may need to make a small cut (incision) in the abscess to drain the pus. In some cases, gauze is packed into the abscess to reduce pain and to continue draining the area. HOME CARE INSTRUCTIONS   Only take over-the-counter or prescription medicines for pain, discomfort, or  fever as directed by your caregiver.  If you were prescribed antibiotics, take them as directed. Finish them even if you start to feel better.  If gauze is used, follow your caregiver's directions for changing the gauze.  To avoid spreading the infection:  Keep your draining abscess covered with a bandage.  Wash your hands well.  Do not share personal care items, towels, or whirlpools with others.  Avoid skin contact with others.  Keep your skin and clothes clean around the abscess.  Keep all follow-up appointments as directed by your caregiver. SEEK MEDICAL CARE IF:   You have increased pain, swelling, redness, fluid drainage, or bleeding.  You have muscle aches, chills, or a general ill feeling.  You have a fever. MAKE SURE YOU:   Understand these instructions.  Will watch your condition.  Will get help right away if you are not doing well or get worse.   This information is not intended to replace advice given to you by your health care provider. Make sure you discuss any questions you have with your health care provider.   Document Released: 08/16/2005 Document Revised: 05/07/2012 Document Reviewed: 01/19/2012 Elsevier Interactive Patient Education 2016 Else[employer] Inc[employer]  Incision and Drainage Incision and drainage is a procedure in which a sac-like structure (cystic structure) is opened and drained. The area to be drained usually contains material such as pus, fluid, or blood.  LET YOUR CAREGIVER KNOW ABOUT:   Allergies to medicine.  Medicines taken, including vitamins, herbs, eyedrops, over-the-counter medicines, and creams.  Use of steroids (  by mouth or creams).  Previous problems with anesthetics or numbing medicines.  History of bleeding problems or blood clots.  Previous surgery.  Other health problems, including diabetes and kidney problems.  Possibility of pregnancy, if this applies. RISKS AND  COMPLICATIONS  Pain.  Bleeding.  Scarring.  Infection. BEFORE THE PROCEDURE  You may need to have an ultrasound or other imaging tests to see how large or deep your cystic structure is. Blood tests may also be used to determine if you have an infection or how severe the infection is. You may need to have a tetanus shot. PROCEDURE  The affected area is cleaned with a cleaning fluid. The cyst area will then be numbed with a medicine (local anesthetic). A small incision will be made in the cystic structure. A syringe or catheter may be used to drain the contents of the cystic structure, or the contents may be squeezed out. The area will then be flushed with a cleansing solution. After cleansing the area, it is often gently packed with a gauze or another wound dressing. Once it is packed, it will be covered with gauze and tape or some other type of wound dressing. AFTER THE PROCEDURE   Often, you will be allowed to go home right after the procedure.  You may be given antibiotic medicine to prevent or heal an infection.  If the area was packed with gauze or some other wound dressing, you will likely need to come back in 1 to 2 days to get it removed.  The area should heal in about 14 days.   This information is not intended to replace advice given to you by your health care provider. Make sure you discuss any questions you have with your health care provider.   Document Released: 05/02/2001 Document Revised: 05/07/2012 Document Reviewed: 01/01/2012 Elsevier Interactive Patient Education [employer]

## 2016-06-03 NOTE — ED Provider Notes (Signed)
CSN: [id number]     Arrival date & time 06/03/16  1108 History  By signing my name below, I, Freida Busmaniana Omoyeni, attest that this documentation has been prepared under the direction and in the presence of non-physician practitioner, Mattie MarlinJessica Laretta Pyatt, PA-C. Electronically Signed: Freida Busmaniana Omoyeni, Scribe. 06/03/2016. 11:48 AM.    Chief Complaint  Patient presents with  . Abscess   The history is provided by the patient. No language interpreter was used.    HPI Comments:  Stephen Stanley is a 25 y.o. male who presents to the Emergency Department complaining of gradually worsening "bumps" to the right hip area x 3 days. He reports constant pain to the site and describes sharp pain when anything rubs up against the site. He denies h/o same. Pt has applied warm compresses with little relief. He has also taken percocet without relief.  He denies fever, chills, nausea, vomiting, numbness tingling to the BLE or groin, penile discharge, scrotal swelling, and bumps to the groin area. Pt notes he recently found fleas at home but denies flea bites or tick bites.  History reviewed. No pertinent past medical history. Past Surgical History  Procedure Laterality Date  . Laparoscopic appendectomy N/A 06/12/2014    Procedure: APPENDECTOMY LAPAROSCOPIC;  Surgeon: Axel FillerArmando Ramirez, MD;  [facility];  Service: General;  Laterality: N/A;   No family history on file. Social History  Substance Use Topics  . Smoking status: Current Every Day Smoker  . Smokeless tobacco: None  . Alcohol Use: Yes    Review of Systems  Constitutional: Negative for fever and chills.  Gastrointestinal: Negative for nausea and vomiting.  Genitourinary: Negative for discharge and scrotal swelling.  Skin: Positive for wound (abscess). Negative for rash.  Neurological: Negative for numbness.   Allergies  Review of patient's allergies indicates no known allergies.  Home Medications   Prior to Admission medications   Medication Sig Start  Date End Date Taking? Authorizing Provider  doxycycline (VIBRAMYCIN) 100 MG capsule Take 1 capsule (100 mg total) by mouth 2 (two) times daily. 06/03/16   Jerre SimonJessica L Blanka Rockholt, PA  ibuprofen (ADVIL,MOTRIN) 600 MG tablet Take 600 mg by mouth every 6 (six) hours as needed for mild pain.    Historical Provider, MD  meloxicam (MOBIC) 15 MG tablet Take 15 mg by mouth daily.  06/07/14   Historical Provider, MD   BP 133/81 mmHg  Pulse 87  Temp(Src) 98.2 F (36.8 C) (Oral)  Resp 14  SpO2 99% Physical Exam  Constitutional: He appears well-developed and well-nourished. No distress.  HENT:  Head: Normocephalic and atraumatic.  Eyes: Conjunctivae are normal.  Neck: Normal range of motion.  Cardiovascular: Normal rate, regular rhythm and normal heart sounds.  Exam reveals no gallop and no friction rub.   No murmur heard. Pulmonary/Chest: Effort normal. No respiratory distress.  Musculoskeletal: Normal range of motion.  Neurological: He is alert. Coordination normal.  Skin: Skin is warm and dry. He is not diaphoretic.  One abscess on right buttock with mild surrounding erythema, mild induration and mild fluctuance 2nd abscess to the right lateral thigh, mild erythema, no induration, and mild fluctuance Signs of mild surrounding cellulitis   Psychiatric: He has a normal mood and affect. His behavior is normal.  Nursing note and vitals reviewed.   ED Course  Procedures   DIAGNOSTIC STUDIES:  Oxygen Saturation is 99% on RA, normal by my interpretation.    COORDINATION OF CARE:  11:43 AM Will incise and drain abscesses. Discussed treatment plan with  pt at bedside and pt agreed to plan.  INCISION AND DRAINAGE PROCEDURE NOTE: Patient identification was confirmed and verbal consent was obtained. This procedure was performed by Mattie Marlin, PA-C at 1:00 PM. Site: right hip and right lateral thigh Sterile procedures observed Needle size: 23  Anesthetic used (type and amt): lidocaine with epi,  2.38ml total Blade size: 15 Drainage: purulent and serosanguineous Complexity: simple   Packing used none Site anesthetized, incision made over site, wound drained and explored loculations, rinsed with copious amounts of normal saline, covered with dry, sterile dressing.  Pt tolerated procedure well without complications.  Instructions for care discussed verbally and pt provided with additional written instructions for homecare and f/u.   MDM   Final diagnoses:  Abscess    Patient with skin abscess. Incision and drainage performed in the ED today.  Abscesses were not large enough to warrant packing or drain placement. Wound recheck in 2 days. Supportive care and return precautions discussed.  Pt sent home with Doxy. The patient appears reasonably screened and/or stabilized for discharge and I doubt any other emergent medical condition requiring further screening, evaluation, or treatment in the ED prior to discharge. Discussed strict return precautions. Patient expressed understanding to the discharge instructions.  I personally performed the services described in this documentation, which was scribed in my presence. The recorded information has been reviewed and is accurate.      Jerre Simon, PA 06/03/16 1300  Glynn Octave, MD 06/03/16 [phone]

## 2016-06-03 NOTE — ED Notes (Signed)
Pt given a gown and told to take pants off and place gown on for assessment

## 2016-06-03 NOTE — ED Notes (Signed)
Patient here with abscess x 2 to right hip and upper leg x 3 days, denies drainage, small in size. Complains of pain

## 2016-06-08 LAB — AEROBIC/ANAEROBIC CULTURE W GRAM STAIN (SURGICAL/DEEP WOUND): Special Requests: NORMAL

## 2016-06-08 LAB — AEROBIC/ANAEROBIC CULTURE (SURGICAL/DEEP WOUND)

## 2016-06-09 ENCOUNTER — Telehealth: Payer: Self-pay | Admitting: *Deleted

## 2016-06-09 NOTE — Telephone Encounter (Signed)
Post ED Visit - Positive Culture Follow-up  Culture report reviewed by antimicrobial stewardship pharmacist:  []  Enzo BiNathan Batchelder, Pharm.D. []  Celedonio MiyamotoJeremy Frens, [address]Ph[address] BCPS []  Garvin FilaMike Maccia, Pharm.D. []  Georgina PillionElizabeth Martin, Pharm.D., BCPS [x]  WeaubleauMinh Pham, [address]Ph[address] BCPS, AAHIVP []  Estella HuskMichelle Turner, Pharm.D., BCPS, AAHIVP []  Tennis Mustassie Stewart, Pharm.D. []  Rob TotowaVincent, [address]Ph[address]  Positive aerobic/anaerobic culture Treated with Doxycycline Hyclate, organism sensitive to the same and no further patient follow-up is required at this time.  Virl AxeRobertson, Addilyn Satterwhite [facility] 06/09/2016, 1:50 PM

## 2017-07-19 ENCOUNTER — Encounter (HOSPITAL_COMMUNITY): Payer: Self-pay | Admitting: Emergency Medicine

## 2017-07-19 ENCOUNTER — Emergency Department (HOSPITAL_COMMUNITY)
Admission: EM | Admit: 2017-07-19 | Discharge: 2017-07-19 | Disposition: A | Discharge status: Home / Self Care | Payer: Managed Care, Other (non HMO) | Attending: Emergency Medicine | Admitting: Emergency Medicine

## 2017-07-19 DIAGNOSIS — Z791 Long term (current) use of non-steroidal anti-inflammatories (NSAID): Secondary | ICD-10-CM | POA: Insufficient documentation

## 2017-07-19 DIAGNOSIS — F17210 Nicotine dependence, cigarettes, uncomplicated: Secondary | ICD-10-CM | POA: Insufficient documentation

## 2017-07-19 DIAGNOSIS — A6001 Herpesviral infection of penis: Secondary | ICD-10-CM

## 2017-07-19 HISTORY — DX: Gastro-esophageal reflux disease without esophagitis: K21.9

## 2017-07-19 LAB — URINALYSIS, ROUTINE W REFLEX MICROSCOPIC
BILIRUBIN URINE: NEGATIVE
Glucose, UA: NEGATIVE mg/dL
Hgb urine dipstick: NEGATIVE
Ketones, ur: NEGATIVE mg/dL
LEUKOCYTES UA: NEGATIVE
NITRITE: NEGATIVE
PH: 6 (ref 5.0–8.0)
Protein, ur: NEGATIVE mg/dL
SPECIFIC GRAVITY, URINE: 1.027 (ref 1.005–1.030)

## 2017-07-19 MED ORDER — ACYCLOVIR 400 MG PO TABS: 400.0000 mg | ORAL_TABLET | Freq: Three times a day (TID) | ORAL | 0 refills | Status: AC

## 2017-07-19 NOTE — ED Provider Notes (Signed)
[facility]-EMERGENCY DEPT Provider Note   CSN: [id number] Arrival date & time: 07/19/17  1223     History   Chief Complaint Chief Complaint  Patient presents with  . Rash    HPI Stephen EdelsonSteven Stanley is a 26 y.o. male.  HPI  Patient is a 26 year old male with no significant past medical history presenting for a rash that appeared on the dorsum of the penis last night. Patient reports a paresthesia-like sensation in his penis 2 days prior to this rash appearing. This rash is nonpruritic, and nonpainful. Patient denies any fever, chills, abdominal pain, dysuria, testicular pain, or penile discharge. Patient reports 2 sexual partners in the past 6 months including a new partner 5 days ago. Patient has a remote history of chlamydia which was treated.   Past Medical History:  Diagnosis Date  . GERD (gastroesophageal reflux disease)     Patient Active Problem List   Diagnosis Date Noted  . Appendicitis, acute 06/11/2014    Past Surgical History:  Procedure Laterality Date  . LAPAROSCOPIC APPENDECTOMY N/A 06/12/2014   Procedure: APPENDECTOMY LAPAROSCOPIC;  Surgeon: Axel FillerArmando Ramirez, MD;  Location: [facility] OR;  Service: General;  Laterality: N/A;       Home Medications    Prior to Admission medications   Medication Sig Start Date End Date Taking? Authorizing Provider  acyclovir (ZOVIRAX) 400 MG tablet Take 1 tablet (400 mg total) by mouth 3 (three) times daily. 07/19/17 07/26/17  Aviva KluverMurray, Anesia Blackwell B, PA-C  doxycycline (VIBRAMYCIN) 100 MG capsule Take 1 capsule (100 mg total) by mouth 2 (two) times daily. 06/03/16   Focht, Joyce CopaJessica L, PA  ibuprofen (ADVIL,MOTRIN) 600 MG tablet Take 600 mg by mouth every 6 (six) hours as needed for mild pain.    [provider]  meloxicam (MOBIC) 15 MG tablet Take 15 mg by mouth daily.  06/07/14   [provider]    Family History No family history on file.  Social History Social History  Substance Use Topics  . Smoking status: Current Every Day  Smoker  . Smokeless tobacco: Never Used  . Alcohol use Yes     Allergies   Patient has no known allergies.   Review of Systems Review of Systems  Constitutional: Negative for chills and fever.  Gastrointestinal: Negative for abdominal pain.  Genitourinary: Negative for discharge, dysuria, penile pain and testicular pain.  Skin:       No rash or lesions elsewhere on body besides penis.     Physical Exam Updated Vital Signs BP 126/77 (BP Location: Right Arm)   Pulse 95   Temp 99.1 F (37.3 C) (Oral)   Resp 16   SpO2 100%   Physical Exam  Constitutional: He appears well-developed and well-nourished. No distress.  Sitting comfortably in bed.  HENT:  Head: Normocephalic and atraumatic.  Eyes: Conjunctivae are normal. Right eye exhibits no discharge. Left eye exhibits no discharge.  EOMs normal to gross examination.  Neck: Normal range of motion.  Pulmonary/Chest:  Normal respiratory effort. Patient converses comfortably. No audible wheeze or stridor.  Abdominal: He exhibits no distension.  Genitourinary:  Genitourinary Comments: Exam performed with EMT chaperone present. Cluster of 2-3 mm vesicles on the dorsum of the penis. No surrounding erythema, or crusting. No exudate. No penile discharge. No tenderness of penis, testicles. No other lesions of penis or testicles. No inguinal adenopathy.  Musculoskeletal: Normal range of motion.  Neurological: He is alert.  Cranial nerves intact to gross observation. Patient moves extremities without difficulty.  Skin: Skin is warm and dry. He is not diaphoretic.  Psychiatric: He has a normal mood and affect. His behavior is normal. Judgment and thought content normal.  Nursing note and vitals reviewed.    ED Treatments / Results  Labs (all labs ordered are listed, but only abnormal results are displayed) Labs Reviewed  HSV CULTURE AND TYPING  URINALYSIS, ROUTINE W REFLEX MICROSCOPIC  RPR  HIV ANTIBODY (ROUTINE TESTING)    GC/CHLAMYDIA PROBE AMP ([place]) NOT AT [facility]  GC/CHLAMYDIA PROBE AMP ([place]) NOT AT [facility]    EKG  EKG Interpretation None       Radiology No results found.  Procedures Procedures (including critical care time)  Medications Ordered in ED Medications - No data to display   Initial Impression / Assessment and Plan / ED Course  I have reviewed the triage vital signs and the nursing notes.  Pertinent labs & imaging results that were available during my care of the patient were reviewed by me and considered in my medical decision making (see chart for details).     Final Clinical Impressions(s) / ED Diagnoses   Final diagnoses:  Herpes simplex infection of penis   MDM  Patient is a 26 year old male presenting with rash on the dorsum of the penis. Exam is consistent with genital herpes. These lesions appeared less than 24 hours ago, so there is no vesicular fluid leaking out of the lesions at this time but viral culture sent to attempt confirmation.. Patient consented to full STI testing including gonorrhea, chlamydia, HSV culture, HIV, syphilis, Trichomonas. Patient prescribed 5-day course of acyclovir and is in agreement with plan of care. Patient instructed to inform all partners.  Patient initially had a tachycardic reading of 101. This is repeated and patient had a pulse of 95. Patient is nontoxic-appearing, afebrile, and in no acute distress on exam.  New Prescriptions Discharge Medication List as of 07/19/2017  3:09 PM    START taking these medications   Details  acyclovir (ZOVIRAX) 400 MG tablet Take 1 tablet (400 mg total) by mouth 3 (three) times daily., Starting Thu 07/19/2017, Until Thu 07/26/2017, Print           Aviva Kluver B, PA-C 07/19/17 2227    Tilden Fossa, MD 07/20/17 [phone]

## 2017-07-19 NOTE — Discharge Instructions (Signed)
Please see the information and instructions below regarding your visit.  Your diagnoses today include:  1. Herpes simplex infection of penis    This is a condition that will flare from time to time and she can take the same medication prescribed today, acyclovir, when a flare occurs.  Tests performed today include: See side panel of your discharge paperwork for testing performed today. Vital signs are listed at the bottom of these instructions.   Tests: Gonorrhea, chlamydia, HIV, syphilis, Trichomonas, herpes culture.  Medications prescribed:    Acyclovir, an antiviral medication.  Take any prescribed medications only as prescribed, and any over the counter medications only as directed on the packaging.  Home care instructions:  -Please notify all sexual partners of the results and use condoms for all sexual intercourse.  Follow-up instructions: Please follow-up with your primary care provider in or health department for further evaluation of your symptoms if they are not completely improved.   The hospital will call you for any positive results. You will not get a call for negative results.  Return instructions:  Please return to the Emergency Department if you experience worsening symptoms.  Please return if you have any other emergent concerns.  Your vital signs today were: BP 123/71    Pulse (!) 101    Temp 99.3 F (37.4 C) (Oral)    Resp 16    SpO2 99% If your blood pressure (BP) was elevated on multiple readings during this visit above 130 for the top number or above 80 for the bottom number, please have this repeated by your primary care provider within one month. --------------  Thank you for allowing us to participate in your care today.

## 2017-07-19 NOTE — ED Notes (Signed)
Patient verbalized understanding of discharge instructions and denies any further needs or questions at this time. VS stable. Patient ambulatory with steady gait.  
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
## 2017-07-20 LAB — GC/CHLAMYDIA PROBE AMP (~~LOC~~) NOT AT ARMC
CHLAMYDIA, DNA PROBE: NEGATIVE
Neisseria Gonorrhea: NEGATIVE

## 2017-07-20 LAB — HIV ANTIBODY (ROUTINE TESTING W REFLEX): HIV Screen 4th Generation wRfx: NONREACTIVE

## 2017-07-20 LAB — RPR: RPR Ser Ql: NONREACTIVE

## 2017-07-22 LAB — HSV CULTURE AND TYPING

## 2018-04-19 ENCOUNTER — Emergency Department (HOSPITAL_COMMUNITY)
Admission: EM | Admit: 2018-04-19 | Discharge: 2018-04-20 | Disposition: E | Payer: Medicaid Other | Attending: General Surgery | Admitting: General Surgery

## 2018-04-19 ENCOUNTER — Encounter (HOSPITAL_COMMUNITY): Payer: Self-pay

## 2018-04-19 ENCOUNTER — Inpatient Hospital Stay (HOSPITAL_COMMUNITY): Payer: Medicaid Other | Admitting: Anesthesiology

## 2018-04-19 ENCOUNTER — Emergency Department (HOSPITAL_COMMUNITY): Payer: Medicaid Other

## 2018-04-19 ENCOUNTER — Encounter (HOSPITAL_COMMUNITY): Admission: EM | Disposition: E | Payer: Self-pay | Source: Home / Self Care

## 2018-04-19 DIAGNOSIS — Y92003 Bedroom of unspecified non-institutional (private) residence as the place of occurrence of the external cause: Secondary | ICD-10-CM | POA: Diagnosis not present

## 2018-04-19 DIAGNOSIS — S21331A Puncture wound without foreign body of right front wall of thorax with penetration into thoracic cavity, initial encounter: Secondary | ICD-10-CM | POA: Diagnosis not present

## 2018-04-19 DIAGNOSIS — S61031A Puncture wound without foreign body of right thumb without damage to nail, initial encounter: Secondary | ICD-10-CM | POA: Insufficient documentation

## 2018-04-19 DIAGNOSIS — T794XXA Traumatic shock, initial encounter: Secondary | ICD-10-CM | POA: Insufficient documentation

## 2018-04-19 DIAGNOSIS — S272XXA Traumatic hemopneumothorax, initial encounter: Secondary | ICD-10-CM | POA: Diagnosis not present

## 2018-04-19 DIAGNOSIS — S31132A Puncture wound of abdominal wall without foreign body, epigastric region without penetration into peritoneal cavity, initial encounter: Secondary | ICD-10-CM | POA: Insufficient documentation

## 2018-04-19 DIAGNOSIS — S35494A Other specified injury of right renal vein, initial encounter: Secondary | ICD-10-CM | POA: Diagnosis not present

## 2018-04-19 DIAGNOSIS — S71132A Puncture wound without foreign body, left thigh, initial encounter: Secondary | ICD-10-CM | POA: Diagnosis not present

## 2018-04-19 DIAGNOSIS — S3519XA Other injury of inferior vena cava, initial encounter: Secondary | ICD-10-CM | POA: Insufficient documentation

## 2018-04-19 DIAGNOSIS — W3400XA Accidental discharge from unspecified firearms or gun, initial encounter: Secondary | ICD-10-CM

## 2018-04-19 DIAGNOSIS — S36899A Unspecified injury of other intra-abdominal organs, initial encounter: Secondary | ICD-10-CM | POA: Insufficient documentation

## 2018-04-19 DIAGNOSIS — S31630A Puncture wound without foreign body of abdominal wall, right upper quadrant with penetration into peritoneal cavity, initial encounter: Secondary | ICD-10-CM | POA: Insufficient documentation

## 2018-04-19 DIAGNOSIS — R402412 Glasgow coma scale score 13-15, at arrival to emergency department: Secondary | ICD-10-CM | POA: Diagnosis not present

## 2018-04-19 DIAGNOSIS — S71032A Puncture wound without foreign body, left hip, initial encounter: Secondary | ICD-10-CM | POA: Diagnosis not present

## 2018-04-19 DIAGNOSIS — S21101A Unspecified open wound of right front wall of thorax without penetration into thoracic cavity, initial encounter: Secondary | ICD-10-CM | POA: Diagnosis present

## 2018-04-19 HISTORY — PX: LAPAROTOMY: SHX154

## 2018-04-19 LAB — CBC
HEMATOCRIT: 38.1 % — AB (ref 39.0–52.0)
Hemoglobin: 12.4 g/dL — ABNORMAL LOW (ref 13.0–17.0)
MCH: 30.5 pg (ref 26.0–34.0)
MCHC: 32.5 g/dL (ref 30.0–36.0)
MCV: 93.8 fL (ref 78.0–100.0)
PLATELETS: 128 10*3/uL — AB (ref 150–400)
RBC: 4.06 MIL/uL — AB (ref 4.22–5.81)
RDW: 12.7 % (ref 11.5–15.5)
WBC: 17.9 10*3/uL — AB (ref 4.0–10.5)

## 2018-04-19 LAB — I-STAT CHEM 8, ED
BUN: 16 mg/dL (ref 6–20)
CALCIUM ION: 1.14 mmol/L — AB (ref 1.15–1.40)
CREATININE: 1 mg/dL (ref 0.61–1.24)
Chloride: 106 mmol/L (ref 101–111)
GLUCOSE: 117 mg/dL — AB (ref 65–99)
HEMATOCRIT: 39 % (ref 39.0–52.0)
HEMOGLOBIN: 13.3 g/dL (ref 13.0–17.0)
Potassium: 3.1 mmol/L — ABNORMAL LOW (ref 3.5–5.1)
Sodium: 143 mmol/L (ref 135–145)
TCO2: 22 mmol/L (ref 22–32)

## 2018-04-19 LAB — COMPREHENSIVE METABOLIC PANEL
ALT: 91 U/L — ABNORMAL HIGH (ref 17–63)
AST: 125 U/L — AB (ref 15–41)
Albumin: 3.4 g/dL — ABNORMAL LOW (ref 3.5–5.0)
Alkaline Phosphatase: 61 U/L (ref 38–126)
Anion gap: 12 (ref 5–15)
BILIRUBIN TOTAL: 0.8 mg/dL (ref 0.3–1.2)
BUN: 14 mg/dL (ref 6–20)
CO2: 20 mmol/L — ABNORMAL LOW (ref 22–32)
Calcium: 8.7 mg/dL — ABNORMAL LOW (ref 8.9–10.3)
Chloride: 110 mmol/L (ref 101–111)
Creatinine, Ser: 1.33 mg/dL — ABNORMAL HIGH (ref 0.61–1.24)
Glucose, Bld: 140 mg/dL — ABNORMAL HIGH (ref 65–99)
POTASSIUM: 3.2 mmol/L — AB (ref 3.5–5.1)
Sodium: 142 mmol/L (ref 135–145)
Total Protein: 5.7 g/dL — ABNORMAL LOW (ref 6.5–8.1)

## 2018-04-19 LAB — PROTIME-INR
INR: 1.2
Prothrombin Time: 15.1 seconds (ref 11.4–15.2)

## 2018-04-19 LAB — I-STAT CG4 LACTIC ACID, ED: LACTIC ACID, VENOUS: 7.32 mmol/L — AB (ref 0.5–1.9)

## 2018-04-19 LAB — ETHANOL: Alcohol, Ethyl (B): <10 mg/dL (ref <10)

## 2018-04-19 LAB — MASSIVE TRANSFUSION PROTOCOL ORDER (BLOOD BANK NOTIFICATION)

## 2018-04-19 LAB — ABO/RH: ABO/RH(D): A POS

## 2018-04-19 LAB — BLOOD PRODUCT ORDER (VERBAL) VERIFICATION

## 2018-04-19 LAB — CDS SEROLOGY

## 2018-04-19 SURGERY — LAPAROTOMY, EXPLORATORY
Anesthesia: General | Site: Abdomen

## 2018-04-19 MED ORDER — SODIUM CHLORIDE 0.9 % IV SOLN
INTRAVENOUS | Status: DC | PRN
  Administered 2018-04-19 (×2): via INTRAVENOUS

## 2018-04-19 MED ORDER — EPINEPHRINE PF 1 MG/10ML IJ SOSY
PREFILLED_SYRINGE | INTRAMUSCULAR | Status: DC | PRN
  Administered 2018-04-19 (×3): 1 mg via INTRAVENOUS

## 2018-04-19 MED ORDER — FENTANYL CITRATE (PF) 100 MCG/2ML IJ SOLN
INTRAMUSCULAR | Status: AC | PRN
  Administered 2018-04-19: 100 ug via INTRAVENOUS
  Administered 2018-04-19: 200 ug via INTRAVENOUS

## 2018-04-19 MED ORDER — SODIUM CHLORIDE 0.9 % IV SOLN
INTRAVENOUS | Status: AC | PRN
  Administered 2018-04-19: 1000 mL via INTRAVENOUS

## 2018-04-19 MED ORDER — 0.9 % SODIUM CHLORIDE (POUR BTL) OPTIME
TOPICAL | Status: DC | PRN
  Administered 2018-04-19: 2000 mL

## 2018-04-19 MED ORDER — SUCCINYLCHOLINE CHLORIDE 20 MG/ML IJ SOLN
INTRAMUSCULAR | Status: AC | PRN
  Administered 2018-04-19: 150 mg via INTRAVENOUS

## 2018-04-19 MED ORDER — VECURONIUM BROMIDE 10 MG IV SOLR
INTRAVENOUS | Status: AC | PRN
  Administered 2018-04-19: 10 mg via INTRAVENOUS

## 2018-04-19 MED ORDER — VECURONIUM BROMIDE 10 MG IV SOLR
INTRAVENOUS | Status: AC
  Filled 2018-04-19: qty 10

## 2018-04-19 MED ORDER — FENTANYL CITRATE (PF) 100 MCG/2ML IJ SOLN
INTRAMUSCULAR | Status: AC
  Filled 2018-04-19: qty 4

## 2018-04-19 MED ORDER — SODIUM BICARBONATE 8.4 % IV SOLN
INTRAVENOUS | Status: DC | PRN
  Administered 2018-04-19: 50 meq via INTRAVENOUS

## 2018-04-19 MED ORDER — LACTATED RINGERS IV SOLN
INTRAVENOUS | Status: DC | PRN
  Administered 2018-04-19 (×2): via INTRAVENOUS

## 2018-04-19 MED ORDER — EPINEPHRINE PF 1 MG/ML IJ SOLN
INTRAVENOUS | Status: DC | PRN
  Administered 2018-04-19: 3 ug/min via INTRAVENOUS

## 2018-04-19 MED ORDER — FENTANYL CITRATE (PF) 100 MCG/2ML IJ SOLN
INTRAMUSCULAR | Status: AC
  Filled 2018-04-19: qty 2

## 2018-04-19 MED ORDER — ETOMIDATE 2 MG/ML IV SOLN
INTRAVENOUS | Status: AC | PRN
  Administered 2018-04-19: 20 mg via INTRAVENOUS

## 2018-04-19 MED ORDER — ONDANSETRON HCL 4 MG/2ML IJ SOLN
INTRAMUSCULAR | Status: AC
  Filled 2018-04-19: qty 2

## 2018-04-19 MED ORDER — ONDANSETRON HCL 4 MG/2ML IJ SOLN
INTRAMUSCULAR | Status: AC | PRN
  Administered 2018-04-19: 4 mg via INTRAVENOUS

## 2018-04-19 MED ORDER — NOREPINEPHRINE 16 MG/250ML-% IV SOLN
0.0000 ug/min | INTRAVENOUS | Status: DC
  Administered 2018-04-19: 10 ug/min via INTRAVENOUS
  Filled 2018-04-19: qty 250

## 2018-04-19 MED ORDER — VASOPRESSIN 20 UNIT/ML IV SOLN
INTRAVENOUS | Status: DC | PRN
  Administered 2018-04-19: 5 [IU] via INTRAVENOUS
  Administered 2018-04-19: 4 [IU] via INTRAVENOUS
  Administered 2018-04-19: 5 [IU] via INTRAVENOUS

## 2018-04-19 MED ORDER — CALCIUM CHLORIDE 10 % IV SOLN
INTRAVENOUS | Status: DC | PRN
  Administered 2018-04-19 (×3): 1 g via INTRAVENOUS

## 2018-04-19 MED ORDER — PHENYLEPHRINE HCL 10 MG/ML IJ SOLN
INTRAMUSCULAR | Status: DC | PRN
  Administered 2018-04-19 (×2): 320 ug via INTRAVENOUS
  Administered 2018-04-19: 40 ug via INTRAVENOUS

## 2018-04-19 SURGICAL SUPPLY — 42 items
BLADE CLIPPER SURG (BLADE) IMPLANT
CANISTER SUCT 3000ML PPV (MISCELLANEOUS) ×3 IMPLANT
CHLORAPREP W/TINT 26ML (MISCELLANEOUS) ×1 IMPLANT
COVER SURGICAL LIGHT HANDLE (MISCELLANEOUS) ×3 IMPLANT
DRAPE LAPAROSCOPIC ABDOMINAL (DRAPES) ×3 IMPLANT
DRAPE WARM FLUID 44X44 (DRAPE) ×3 IMPLANT
DRSG OPSITE POSTOP 4X10 (GAUZE/BANDAGES/DRESSINGS) IMPLANT
DRSG OPSITE POSTOP 4X8 (GAUZE/BANDAGES/DRESSINGS) IMPLANT
ELECT BLADE 6.5 EXT (BLADE) ×4 IMPLANT
ELECT CAUTERY BLADE 6.4 (BLADE) ×3 IMPLANT
ELECT REM PT RETURN 9FT ADLT (ELECTROSURGICAL) ×3
ELECTRODE REM PT RTRN 9FT ADLT (ELECTROSURGICAL) ×1 IMPLANT
GAUZE SPONGE 4X4 16PLY XRAY LF (GAUZE/BANDAGES/DRESSINGS) ×4 IMPLANT
GLOVE BIO SURGEON STRL SZ8 (GLOVE) ×3 IMPLANT
GLOVE BIOGEL PI IND STRL 8 (GLOVE) ×1 IMPLANT
GLOVE BIOGEL PI INDICATOR 8 (GLOVE) ×2
GOWN STRL REUS W/ TWL LRG LVL3 (GOWN DISPOSABLE) ×1 IMPLANT
GOWN STRL REUS W/ TWL XL LVL3 (GOWN DISPOSABLE) ×1 IMPLANT
GOWN STRL REUS W/TWL LRG LVL3 (GOWN DISPOSABLE) ×3
GOWN STRL REUS W/TWL XL LVL3 (GOWN DISPOSABLE) ×3
KIT BASIN OR (CUSTOM PROCEDURE TRAY) ×3 IMPLANT
KIT TURNOVER KIT B (KITS) ×3 IMPLANT
LIGASURE IMPACT 36 18CM CVD LR (INSTRUMENTS) IMPLANT
NS IRRIG 1000ML POUR BTL (IV SOLUTION) ×6 IMPLANT
PACK GENERAL/GYN (CUSTOM PROCEDURE TRAY) ×3 IMPLANT
PAD ARMBOARD 7.5X6 YLW CONV (MISCELLANEOUS) ×3 IMPLANT
SPECIMEN JAR LARGE (MISCELLANEOUS) IMPLANT
SPONGE LAP 18X18 X RAY DECT (DISPOSABLE) ×32 IMPLANT
STAPLER VISISTAT 35W (STAPLE) ×3 IMPLANT
SUCTION POOLE TIP (SUCTIONS) ×3 IMPLANT
SUT ETHILON 1 LR 30 (SUTURE) ×2 IMPLANT
SUT PDS AB 1 TP1 54 (SUTURE) ×2 IMPLANT
SUT PDS AB 1 TP1 96 (SUTURE) ×6 IMPLANT
SUT PROLENE 3 0 SH 1 (SUTURE) ×2 IMPLANT
SUT SILK 2 0 SH CR/8 (SUTURE) ×3 IMPLANT
SUT SILK 2 0 TIES 10X30 (SUTURE) ×3 IMPLANT
SUT SILK 3 0 SH CR/8 (SUTURE) ×3 IMPLANT
SUT SILK 3 0 TIES 10X30 (SUTURE) ×3 IMPLANT
SUT SILK 3 0SH CR/8 30 (SUTURE) ×2 IMPLANT
TOWEL OR 17X26 10 PK STRL BLUE (TOWEL DISPOSABLE) ×3 IMPLANT
TRAY FOLEY MTR SLVR 16FR STAT (SET/KITS/TRAYS/PACK) ×2 IMPLANT
YANKAUER SUCT BULB TIP NO VENT (SUCTIONS) ×2 IMPLANT

## 2018-04-20 LAB — TYPE AND SCREEN
ABO/RH(D): A POS
Antibody Screen: NEGATIVE
UNIT DIVISION: 0
UNIT DIVISION: 0
UNIT DIVISION: 0
UNIT DIVISION: 0
UNIT DIVISION: 0
UNIT DIVISION: 0
UNIT DIVISION: 0
UNIT DIVISION: 0
UNIT DIVISION: 0
UNIT DIVISION: 0
UNIT DIVISION: 0
UNIT DIVISION: 0
Unit division: 0
Unit division: 0
Unit division: 0
Unit division: 0
Unit division: 0
Unit division: 0
Unit division: 0
Unit division: 0
Unit division: 0
Unit division: 0
Unit division: 0
Unit division: 0
Unit division: 0
Unit division: 0
Unit division: 0
Unit division: 0
Unit division: 0
Unit division: 0

## 2018-04-20 LAB — PREPARE FRESH FROZEN PLASMA
UNIT DIVISION: 0
UNIT DIVISION: 0
UNIT DIVISION: 0
UNIT DIVISION: 0
UNIT DIVISION: 0
Unit division: 0
Unit division: 0
Unit division: 0
Unit division: 0
Unit division: 0

## 2018-04-20 LAB — BPAM RBC
BLOOD PRODUCT EXPIRATION DATE: 201906092359
BLOOD PRODUCT EXPIRATION DATE: 201906172359
BLOOD PRODUCT EXPIRATION DATE: 201906182359
BLOOD PRODUCT EXPIRATION DATE: 201906182359
BLOOD PRODUCT EXPIRATION DATE: 201906212359
BLOOD PRODUCT EXPIRATION DATE: 201906222359
BLOOD PRODUCT EXPIRATION DATE: 201906222359
BLOOD PRODUCT EXPIRATION DATE: 201906222359
BLOOD PRODUCT EXPIRATION DATE: 201906252359
BLOOD PRODUCT EXPIRATION DATE: 201906252359
BLOOD PRODUCT EXPIRATION DATE: 201906262359
BLOOD PRODUCT EXPIRATION DATE: 201906262359
BLOOD PRODUCT EXPIRATION DATE: 201906262359
BLOOD PRODUCT EXPIRATION DATE: 201906262359
BLOOD PRODUCT EXPIRATION DATE: 201906272359
BLOOD PRODUCT EXPIRATION DATE: 201906272359
BLOOD PRODUCT EXPIRATION DATE: 201906282359
Blood Product Expiration Date: 201906092359
Blood Product Expiration Date: 201906182359
Blood Product Expiration Date: 201906182359
Blood Product Expiration Date: 201906182359
Blood Product Expiration Date: 201906182359
Blood Product Expiration Date: 201906182359
Blood Product Expiration Date: 201906212359
Blood Product Expiration Date: 201906212359
Blood Product Expiration Date: 201906222359
Blood Product Expiration Date: 201906222359
Blood Product Expiration Date: 201906222359
Blood Product Expiration Date: 201906222359
Blood Product Expiration Date: 201906262359
ISSUE DATE / TIME: 201905310316
ISSUE DATE / TIME: 201905310406
ISSUE DATE / TIME: 201905310406
ISSUE DATE / TIME: 201905310423
ISSUE DATE / TIME: 201905310423
ISSUE DATE / TIME: 201905310423
ISSUE DATE / TIME: 201905310442
ISSUE DATE / TIME: 201905310448
ISSUE DATE / TIME: 201905310500
ISSUE DATE / TIME: 201905310500
ISSUE DATE / TIME: 201905310500
ISSUE DATE / TIME: 201905310515
ISSUE DATE / TIME: 201905310519
ISSUE DATE / TIME: 201905311316
ISSUE DATE / TIME: 201905310316
ISSUE DATE / TIME: 201905310423
ISSUE DATE / TIME: 201905310442
ISSUE DATE / TIME: 201905310448
ISSUE DATE / TIME: 201905310448
ISSUE DATE / TIME: 201905310448
ISSUE DATE / TIME: 201905310500
ISSUE DATE / TIME: 201905310500
ISSUE DATE / TIME: 201905310500
ISSUE DATE / TIME: 201905310515
ISSUE DATE / TIME: 201905310515
ISSUE DATE / TIME: 201905310519
ISSUE DATE / TIME: 201905310956
ISSUE DATE / TIME: 201905311536
ISSUE DATE / TIME: 201905311704
ISSUE DATE / TIME: 201906010057
UNIT TYPE AND RH: 5100
UNIT TYPE AND RH: 5100
UNIT TYPE AND RH: 5100
UNIT TYPE AND RH: 5100
UNIT TYPE AND RH: 6200
UNIT TYPE AND RH: 6200
UNIT TYPE AND RH: 6200
UNIT TYPE AND RH: 6200
UNIT TYPE AND RH: 6200
UNIT TYPE AND RH: 6200
UNIT TYPE AND RH: 6200
UNIT TYPE AND RH: 6200
Unit Type and Rh: 5100
Unit Type and Rh: 5100
Unit Type and Rh: 5100
Unit Type and Rh: 6200
Unit Type and Rh: 6200
Unit Type and Rh: 6200
Unit Type and Rh: 6200
Unit Type and Rh: 6200
Unit Type and Rh: 6200
Unit Type and Rh: 6200
Unit Type and Rh: 6200
Unit Type and Rh: 6200
Unit Type and Rh: 6200
Unit Type and Rh: 6200
Unit Type and Rh: 6200
Unit Type and Rh: 6200
Unit Type and Rh: 9500
Unit Type and Rh: 9500

## 2018-04-20 LAB — BPAM PLATELET PHERESIS
BLOOD PRODUCT EXPIRATION DATE: 201906022359
BLOOD PRODUCT EXPIRATION DATE: 201906022359
Blood Product Expiration Date: 201906022359
ISSUE DATE / TIME: 201905310417
ISSUE DATE / TIME: 201905310518
ISSUE DATE / TIME: 201905311236
UNIT TYPE AND RH: 600
UNIT TYPE AND RH: 7300
Unit Type and Rh: 6200

## 2018-04-20 LAB — BPAM FFP
BLOOD PRODUCT EXPIRATION DATE: 201906052359
BLOOD PRODUCT EXPIRATION DATE: 201906052359
BLOOD PRODUCT EXPIRATION DATE: 201906052359
BLOOD PRODUCT EXPIRATION DATE: 201906052359
BLOOD PRODUCT EXPIRATION DATE: 201906052359
BLOOD PRODUCT EXPIRATION DATE: 201906052359
Blood Product Expiration Date: 201906052359
Blood Product Expiration Date: 201906052359
Blood Product Expiration Date: 201906052359
Blood Product Expiration Date: 201906052359
Blood Product Expiration Date: 201906052359
Blood Product Expiration Date: 201906052359
Blood Product Expiration Date: 201906052359
Blood Product Expiration Date: 201906052359
Blood Product Expiration Date: 201906052359
Blood Product Expiration Date: 201906052359
ISSUE DATE / TIME: 201905310316
ISSUE DATE / TIME: 201905310407
ISSUE DATE / TIME: 201905310407
ISSUE DATE / TIME: 201905310445
ISSUE DATE / TIME: 201905310507
ISSUE DATE / TIME: 201905310507
ISSUE DATE / TIME: 201905310507
ISSUE DATE / TIME: 201905312250
ISSUE DATE / TIME: 201905310316
ISSUE DATE / TIME: 201905310430
ISSUE DATE / TIME: 201905310430
ISSUE DATE / TIME: 201905310430
ISSUE DATE / TIME: 201905310430
ISSUE DATE / TIME: 201905310507
ISSUE DATE / TIME: 201905310507
ISSUE DATE / TIME: 201905312211
UNIT TYPE AND RH: 600
UNIT TYPE AND RH: 6200
UNIT TYPE AND RH: 6200
UNIT TYPE AND RH: 6200
UNIT TYPE AND RH: 6200
UNIT TYPE AND RH: 6200
UNIT TYPE AND RH: 6200
Unit Type and Rh: 600
Unit Type and Rh: 6200
Unit Type and Rh: 6200
Unit Type and Rh: 6200
Unit Type and Rh: 6200
Unit Type and Rh: 6200
Unit Type and Rh: 6200
Unit Type and Rh: 6200
Unit Type and Rh: 6200

## 2018-04-20 LAB — PREPARE PLATELET PHERESIS
UNIT DIVISION: 0
UNIT DIVISION: 0
UNIT DIVISION: 0

## 2018-04-20 LAB — BPAM CRYOPRECIPITATE
BLOOD PRODUCT EXPIRATION DATE: 201905310900
Blood Product Expiration Date: 201905310838
ISSUE DATE / TIME: 201905310443
ISSUE DATE / TIME: 201905310420
UNIT TYPE AND RH: 5100
Unit Type and Rh: 5100

## 2018-04-20 LAB — PREPARE CRYOPRECIPITATE
Unit division: 0
Unit division: 0

## 2018-04-20 NOTE — OR Nursing (Signed)
Time of Death [phone]

## 2018-04-20 NOTE — Progress Notes (Signed)
   04/23/18 0500  Clinical Encounter Type  Visited With Other (Comment)  Visit Type Initial  Referral From Nurse  Consult/Referral To Chaplain  Spiritual Encounters  Spiritual Needs Emotional    Pt was taken to OR. Chaplain helped call the father who responded and said he was in [place] and that the pt's mother and sister were on the way coming. Chaplain met family and escorted to [place] waiting rm and provided compassionate presence.  Jvion Turgeon a Medical sales representative, [employer]
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
## 2018-04-20 NOTE — Anesthesia Procedure Notes (Signed)
Arterial Line Insertion Start/EndJun 17, 2019 4:15 AM, May 06, 2018 4:19 AM Performed by: Val Eagle, MD  Patient location: OR. Preanesthetic checklist: IV checked and monitors and equipment checked Left, radial was placed Catheter size: 20 Fr Hand hygiene performed   Attempts: 1 Procedure performed using ultrasound guided (No image aquired) technique. Ultrasound Notes:anatomy identified and needle tip was noted to be adjacent to the nerve/plexus identified Following insertion, dressing applied. Post procedure assessment: normal and unchanged  Patient tolerated the procedure well with no immediate complications.

## 2018-04-20 NOTE — Transfer of Care (Signed)
Immediate Anesthesia Transfer of Care Note  Patient: Stephen Stanley  Procedure(s) Performed: EXPLORATORY LAPAROTOMY (N/A Abdomen)  Patient Location:  Patient expired.

## 2018-04-20 NOTE — Progress Notes (Signed)
RT NOTE:  Pt transported to OR on vent, pt switched to OR vent. Report given to Will, RRT and vent transported to department until ICU placement.
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
## 2018-04-20 NOTE — ED Notes (Signed)
R sided chest tube to be placed by trauma

## 2018-04-20 NOTE — Anesthesia Procedure Notes (Signed)
Central Venous Catheter Insertion Performed by: Oleta Mouse, MD, anesthesiologist Start/End06-13-19 4:09 AM, 05-02-18 4:13 AM Patient location: OR. Preanesthetic checklist: IV checked and monitors and equipment checked Position: supine Hand hygiene performed  Catheter size: 9 Fr Total catheter length 10. MAC introducer Procedure performed without using ultrasound guided technique. Attempts: 1 Following insertion, dressing applied. Post procedure assessment: blood return through all ports, free fluid flow and no air  Patient tolerated the procedure well with no immediate complications.

## 2018-04-20 NOTE — Procedures (Signed)
Chest Tube Insertion Procedure Note  Late entry  Pre-operative Diagnosis: Pneumothorax  Post-operative Diagnosis: Pneumothorax  Procedure Details  Emergency consent was obtained.  Using sterile technique right chest was prepped in sterile fashion.  I initially attempted to place a 14 French pigtail catheter.  I gained entry to the chest with a needle and guidewire but the dilator would not thread easily so this was abandoned.  I made a larger incision and placed a 20 French right chest tube along the anterior axillary line 3 cm cephalad to the nipple level.  There was a rush of air.  The tube was hooked to Pleur-evac suction and secured with silk suture.  Estimated Blood Loss:  less than 50 mL         Specimens:  None              Complications:  None; patient tolerated the procedure well.         Disposition: trauma bay         Condition: unstable  Violeta Gelinas, MD, MPH, FACS Trauma: [phone] General Surgery: [phone]

## 2018-04-20 NOTE — ED Notes (Signed)
BP 64/33 prior to transport to OR, per Trauma transport to OR

## 2018-04-20 NOTE — ED Notes (Signed)
Pt vomiting.

## 2018-04-20 NOTE — ED Notes (Signed)
Pt transferred to OR, pulses lost in elevator during transport, CPR initiated and transported to OR
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
## 2018-04-20 DEATH — deceased

## 2018-04-22 ENCOUNTER — Encounter (HOSPITAL_COMMUNITY): Payer: Self-pay | Admitting: General Surgery

## 2018-04-22 ENCOUNTER — Encounter (HOSPITAL_COMMUNITY): Payer: Self-pay | Admitting: Emergency Medicine
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
